# Patient Record
Sex: Female | Born: 1960 | Race: Black or African American | Hispanic: No | Marital: Married | Smoking: Never smoker
Health system: Southern US, Community
[De-identification: ages and names within clinical notes are randomized; demographics above are authoritative.]

## PROBLEM LIST (undated history)

## (undated) DIAGNOSIS — I10 Essential (primary) hypertension: Secondary | ICD-10-CM

## (undated) DIAGNOSIS — G43909 Migraine, unspecified, not intractable, without status migrainosus: Secondary | ICD-10-CM

## (undated) DIAGNOSIS — E785 Hyperlipidemia, unspecified: Secondary | ICD-10-CM

## (undated) DIAGNOSIS — K219 Gastro-esophageal reflux disease without esophagitis: Secondary | ICD-10-CM

## (undated) DIAGNOSIS — K589 Irritable bowel syndrome without diarrhea: Secondary | ICD-10-CM

## (undated) DIAGNOSIS — G459 Transient cerebral ischemic attack, unspecified: Secondary | ICD-10-CM

## (undated) HISTORY — DX: Gastro-esophageal reflux disease without esophagitis: K21.9

## (undated) HISTORY — PX: ABDOMINAL HYSTERECTOMY: SHX81

## (undated) HISTORY — DX: Irritable bowel syndrome, unspecified: K58.9

## (undated) HISTORY — DX: Transient cerebral ischemic attack, unspecified: G45.9

## (undated) HISTORY — DX: Essential (primary) hypertension: I10

## (undated) HISTORY — PX: BUNIONECTOMY: SHX129

## (undated) HISTORY — DX: Migraine, unspecified, not intractable, without status migrainosus: G43.909

## (undated) HISTORY — PX: OTHER SURGICAL HISTORY: SHX169

## (undated) HISTORY — DX: Hyperlipidemia, unspecified: E78.5

---

## 1998-06-16 ENCOUNTER — Ambulatory Visit (HOSPITAL_COMMUNITY): Admission: RE | Admit: 1998-06-16 | Discharge: 1998-06-16 | Payer: Self-pay | Admitting: Family Medicine

## 1999-05-09 ENCOUNTER — Encounter: Payer: Self-pay | Admitting: Obstetrics and Gynecology

## 1999-05-14 ENCOUNTER — Inpatient Hospital Stay (HOSPITAL_COMMUNITY): Admission: RE | Admit: 1999-05-14 | Discharge: 1999-05-17 | Payer: Self-pay | Admitting: Obstetrics and Gynecology

## 2000-09-11 ENCOUNTER — Ambulatory Visit (HOSPITAL_COMMUNITY): Admission: RE | Admit: 2000-09-11 | Discharge: 2000-09-11 | Payer: Self-pay | Admitting: Gastroenterology

## 2001-01-08 ENCOUNTER — Encounter: Payer: Self-pay | Admitting: Family Medicine

## 2001-01-08 ENCOUNTER — Ambulatory Visit (HOSPITAL_COMMUNITY): Admission: RE | Admit: 2001-01-08 | Discharge: 2001-01-08 | Payer: Self-pay | Admitting: Family Medicine

## 2001-05-29 ENCOUNTER — Other Ambulatory Visit: Admission: RE | Admit: 2001-05-29 | Discharge: 2001-05-29 | Payer: Self-pay | Admitting: Obstetrics and Gynecology

## 2001-12-29 ENCOUNTER — Ambulatory Visit (HOSPITAL_COMMUNITY): Admission: RE | Admit: 2001-12-29 | Discharge: 2001-12-29 | Payer: Self-pay | Admitting: Family Medicine

## 2001-12-29 ENCOUNTER — Encounter: Payer: Self-pay | Admitting: Family Medicine

## 2002-06-17 ENCOUNTER — Other Ambulatory Visit: Admission: RE | Admit: 2002-06-17 | Discharge: 2002-06-17 | Payer: Self-pay | Admitting: Obstetrics and Gynecology

## 2002-07-29 ENCOUNTER — Ambulatory Visit (HOSPITAL_COMMUNITY): Admission: RE | Admit: 2002-07-29 | Discharge: 2002-07-29 | Payer: Self-pay | Admitting: Family Medicine

## 2002-07-29 ENCOUNTER — Encounter: Payer: Self-pay | Admitting: Family Medicine

## 2003-07-01 ENCOUNTER — Other Ambulatory Visit: Admission: RE | Admit: 2003-07-01 | Discharge: 2003-07-01 | Payer: Self-pay | Admitting: Obstetrics and Gynecology

## 2004-08-13 ENCOUNTER — Other Ambulatory Visit: Admission: RE | Admit: 2004-08-13 | Discharge: 2004-08-13 | Payer: Self-pay | Admitting: Obstetrics and Gynecology

## 2004-09-14 ENCOUNTER — Encounter: Admission: RE | Admit: 2004-09-14 | Discharge: 2004-09-14 | Payer: Self-pay | Admitting: Gastroenterology

## 2005-09-26 ENCOUNTER — Other Ambulatory Visit: Admission: RE | Admit: 2005-09-26 | Discharge: 2005-09-26 | Payer: Self-pay | Admitting: Obstetrics and Gynecology

## 2007-01-29 ENCOUNTER — Ambulatory Visit: Admission: RE | Admit: 2007-01-29 | Discharge: 2007-01-29 | Payer: Self-pay | Admitting: Family Medicine

## 2007-01-29 ENCOUNTER — Ambulatory Visit: Payer: Self-pay | Admitting: Vascular Surgery

## 2008-12-27 ENCOUNTER — Encounter: Admission: RE | Admit: 2008-12-27 | Discharge: 2008-12-27 | Payer: Self-pay | Admitting: Obstetrics and Gynecology

## 2010-02-03 ENCOUNTER — Encounter: Payer: Self-pay | Admitting: Emergency Medicine

## 2010-02-03 ENCOUNTER — Inpatient Hospital Stay (HOSPITAL_COMMUNITY): Admission: EM | Admit: 2010-02-03 | Discharge: 2010-02-06 | Payer: Self-pay | Admitting: Pediatrics

## 2010-02-03 ENCOUNTER — Ambulatory Visit: Payer: Self-pay | Admitting: Cardiovascular Disease

## 2010-02-05 ENCOUNTER — Encounter (INDEPENDENT_AMBULATORY_CARE_PROVIDER_SITE_OTHER): Payer: Self-pay | Admitting: Pediatrics

## 2010-05-04 ENCOUNTER — Encounter: Payer: Self-pay | Admitting: Internal Medicine

## 2010-06-23 ENCOUNTER — Emergency Department (HOSPITAL_COMMUNITY): Admission: EM | Admit: 2010-06-23 | Discharge: 2010-06-23 | Payer: Self-pay | Admitting: Emergency Medicine

## 2010-11-12 ENCOUNTER — Encounter
Admission: RE | Admit: 2010-11-12 | Discharge: 2010-11-12 | Payer: Self-pay | Source: Home / Self Care | Attending: Family Medicine | Admitting: Family Medicine

## 2010-12-23 ENCOUNTER — Encounter: Payer: Self-pay | Admitting: Obstetrics and Gynecology

## 2011-01-03 NOTE — Procedures (Signed)
Summary: Pt Avtivity Report  Pt Avtivity Report   Imported By: Erle Crocker 06/27/2010 08:44:49  _____________________________________________________________________  External Attachment:    Type:   Image     Comment:   External Document

## 2011-02-16 LAB — DIFFERENTIAL
Basophils Absolute: 0 10*3/uL (ref 0.0–0.1)
Eosinophils Absolute: 0.3 10*3/uL (ref 0.0–0.7)
Eosinophils Relative: 5 % (ref 0–5)
Monocytes Absolute: 0.6 10*3/uL (ref 0.1–1.0)
Monocytes Relative: 10 % (ref 3–12)
Neutro Abs: 3.7 10*3/uL (ref 1.7–7.7)

## 2011-02-16 LAB — COMPREHENSIVE METABOLIC PANEL
AST: 18 U/L (ref 0–37)
Alkaline Phosphatase: 39 U/L (ref 39–117)
BUN: 13 mg/dL (ref 6–23)
CO2: 26 mEq/L (ref 19–32)
GFR calc Af Amer: >60 mL/min (ref >60)
GFR calc non Af Amer: >60 mL/min (ref >60)
Sodium: 135 mEq/L (ref 135–145)
Total Bilirubin: 0.6 mg/dL (ref 0.3–1.2)
Total Protein: 7.7 g/dL (ref 6.0–8.3)

## 2011-02-16 LAB — POCT CARDIAC MARKERS: Troponin i, poc: <0.05 ng/mL (ref 0.00–0.09)

## 2011-02-16 LAB — MAGNESIUM: Magnesium: 2.3 mg/dL (ref 1.5–2.5)

## 2011-02-16 LAB — CBC
Hemoglobin: 12.2 g/dL (ref 12.0–15.0)
MCHC: 33.5 g/dL (ref 30.0–36.0)
MCV: 85.2 fL (ref 78.0–100.0)
RBC: 4.28 MIL/uL (ref 3.87–5.11)
WBC: 6.4 10*3/uL (ref 4.0–10.5)

## 2011-02-16 LAB — TSH: TSH: 0.661 u[IU]/mL (ref 0.350–4.500)

## 2011-02-25 LAB — PROTEIN C, TOTAL
Protein C, Total: 106 % (ref 70–140)
Protein C, Total: 119 % (ref 70–140)

## 2011-02-25 LAB — LUPUS ANTICOAGULANT PANEL
DRVVT: 38.5 secs (ref 36.2–44.3)
PTT Lupus Anticoagulant: 33.5 secs (ref 32.0–43.4)

## 2011-02-25 LAB — PROTEIN C ACTIVITY: Protein C Activity: 137 % — ABNORMAL HIGH (ref 75–133)

## 2011-02-25 LAB — DRUGS OF ABUSE SCREEN W/O ALC, ROUTINE URINE
Amphetamine Screen, Ur: NEGATIVE
Barbiturate Quant, Ur: NEGATIVE
Creatinine,U: 175.9 mg/dL
Marijuana Metabolite: NEGATIVE
Propoxyphene: NEGATIVE

## 2011-02-25 LAB — PROTIME-INR
INR: 0.97 (ref 0.00–1.49)
Prothrombin Time: 12.8 seconds (ref 11.6–15.2)

## 2011-02-25 LAB — URINE MICROSCOPIC-ADD ON

## 2011-02-25 LAB — HOMOCYSTEINE: Homocysteine: 10.3 umol/L (ref 4.0–15.4)

## 2011-02-25 LAB — DIFFERENTIAL
Basophils Absolute: 0.1 10*3/uL (ref 0.0–0.1)
Basophils Relative: 0 % (ref 0–1)
Eosinophils Absolute: 0.1 10*3/uL (ref 0.0–0.7)
Eosinophils Relative: 1 % (ref 0–5)
Lymphocytes Relative: 11 % — ABNORMAL LOW (ref 12–46)
Lymphs Abs: 1.5 10*3/uL (ref 0.7–4.0)
Monocytes Absolute: 0.6 10*3/uL (ref 0.1–1.0)
Monocytes Relative: 4 % (ref 3–12)
Neutro Abs: 11.5 10*3/uL — ABNORMAL HIGH (ref 1.7–7.7)

## 2011-02-25 LAB — URINALYSIS, ROUTINE W REFLEX MICROSCOPIC
Bilirubin Urine: NEGATIVE
Glucose, UA: NEGATIVE mg/dL
Ketones, ur: NEGATIVE mg/dL
Protein, ur: NEGATIVE mg/dL
pH: 5.5 (ref 5.0–8.0)

## 2011-02-25 LAB — PROTEIN S, TOTAL
Protein S Ag, Total: 102 % (ref 70–140)
Protein S Ag, Total: 87 % (ref 70–140)

## 2011-02-25 LAB — BETA-2-GLYCOPROTEIN I ABS, IGG/M/A
Beta-2 Glyco I IgG: <3 U/mL (ref <=15)
Beta-2-Glycoprotein I IgA: 3 U/mL (ref <=15)
Beta-2-Glycoprotein I IgA: 4 U/mL (ref <=15)
Beta-2-Glycoprotein I IgM: <3 U/mL (ref <=15)

## 2011-02-25 LAB — COMPREHENSIVE METABOLIC PANEL
Alkaline Phosphatase: 46 U/L (ref 39–117)
Calcium: 8.8 mg/dL (ref 8.4–10.5)
Chloride: 105 mEq/L (ref 96–112)
GFR calc non Af Amer: >60 mL/min (ref >60)
Glucose, Bld: 103 mg/dL — ABNORMAL HIGH (ref 70–99)

## 2011-02-25 LAB — CBC
Hemoglobin: 12.6 g/dL (ref 12.0–15.0)
MCHC: 30.4 g/dL (ref 30.0–36.0)

## 2011-02-25 LAB — LIPID PANEL
Cholesterol: 174 mg/dL (ref 0–200)
LDL Cholesterol: 126 mg/dL — ABNORMAL HIGH (ref 0–99)
Triglycerides: 57 mg/dL (ref <150)
VLDL: 11 mg/dL (ref 0–40)

## 2011-02-25 LAB — HEMOGLOBIN A1C
Hgb A1c MFr Bld: 6.4 % — ABNORMAL HIGH (ref 4.6–6.1)
Mean Plasma Glucose: 137 mg/dL

## 2011-02-25 LAB — CARDIOLIPIN ANTIBODIES, IGG, IGM, IGA: Anticardiolipin IgM: <3 MPL U/mL — ABNORMAL LOW (ref <10)

## 2011-02-25 LAB — CK TOTAL AND CKMB (NOT AT ARMC)
CK, MB: 0.6 ng/mL (ref 0.3–4.0)
Total CK: 81 U/L (ref 7–177)

## 2011-02-25 LAB — GLUCOSE, CAPILLARY: Glucose-Capillary: 108 mg/dL — ABNORMAL HIGH (ref 70–99)

## 2011-02-25 LAB — ANTITHROMBIN III
AntiThromb III Func: 107 % (ref 76–126)
AntiThromb III Func: 90 % (ref 76–126)

## 2011-02-25 LAB — RPR: RPR Ser Ql: NONREACTIVE

## 2011-02-25 LAB — COMPLEMENT, TOTAL: Compl, Total (CH50): 54 U/mL (ref 31–60)

## 2011-02-25 LAB — ANA: Anti Nuclear Antibody(ANA): NEGATIVE

## 2011-02-25 LAB — PROTHROMBIN GENE MUTATION

## 2011-04-19 NOTE — Procedures (Signed)
[facility]  Patient:    Mackenzie Carr, Mackenzie Carr              MRN: [id number] Proc. Date: 09/11/00 Adm. Date:  [id number] Disc. Date: [id number] Attending:  Nelda Marseille CC:         Dario Guardian, M.D.  Guy Sandifer Arleta [place], M.D.   Procedure Report  PROCEDURE:  Colonoscopy.  INDICATION:  Patient with multiple GI complaints, actually already improved. Family history of colon cancer on both sides of the family.  Consent was signed after risks, benefits, and options thoroughly discussed in the office.  MEDICATIONS:  Demerol 50 mg, Versed 5 mg.  DESCRIPTION OF PROCEDURE:  Rectal inspection is pertinent for external hemorrhoids.  Digital exam was negative.  Video colonoscope was inserted, fairly easily advanced around the colon to the cecum.  This did require rolling her on her back and some abdominal pressure.  The cecum was identified by the appendiceal orifice and the ileocecal valve.  In fact, the scope was inserted a short way into the terminal ileum, which was normal.  Photo documentation was obtained.  The scope was slowly withdrawn.  No obvious abnormality was seen on insertion and slow withdrawal to the rectum.  There was minimal liquid stools that required washing and suctioning, but no abnormalities were seen.  Her colon was tortuous, particularly in the flexures and the sigmoid, and when we fell back around a tortuous curve, we did try to re-advance around the corners to decrease the chance of missing things, but no abnormalities were seen.  Once back in the rectum, the scope was retroflexed, pertinent for some internal hemorrhoids.  The scope was straightened, air withdrawn, and the scope removed.  The patient tolerated the procedure well. There was no obvious immediate complication.  ENDOSCOPIC DIAGNOSES: 1. Internal-external hemorrhoids. 2. Otherwise within normal limits to the terminal ileum except for some     tortuosity.  PLAN:  Yearly rectals and guaiacs per Dr. Katrinka Blazing or Henderson Cloud.  I will see her back p.r.n.  Might consider an antispasmodic if cramps or bloating continue, otherwise recheck screening questionably in five to seven years. DD:  09/11/00 TD:  09/13/00 Job: [id number]

## 2011-09-02 HISTORY — PX: COLONOSCOPY: SHX174

## 2012-12-31 ENCOUNTER — Other Ambulatory Visit: Payer: Self-pay

## 2013-08-16 ENCOUNTER — Other Ambulatory Visit: Payer: Self-pay | Admitting: Obstetrics and Gynecology

## 2013-08-31 ENCOUNTER — Ambulatory Visit
Admission: RE | Admit: 2013-08-31 | Discharge: 2013-08-31 | Disposition: A | Discharge status: Home / Self Care | Payer: 59 | Source: Ambulatory Visit | Attending: Family Medicine | Admitting: Family Medicine

## 2013-08-31 ENCOUNTER — Other Ambulatory Visit: Payer: Self-pay | Admitting: Family Medicine

## 2013-08-31 DIAGNOSIS — M545 Low back pain: Secondary | ICD-10-CM

## 2013-11-24 ENCOUNTER — Telehealth: Payer: Self-pay | Admitting: Cardiology

## 2013-11-24 NOTE — Telephone Encounter (Deleted)
error 

## 2013-11-26 ENCOUNTER — Encounter: Payer: Self-pay | Admitting: General Surgery

## 2013-11-26 ENCOUNTER — Encounter: Payer: Self-pay | Admitting: Cardiology

## 2013-11-26 ENCOUNTER — Ambulatory Visit (INDEPENDENT_AMBULATORY_CARE_PROVIDER_SITE_OTHER): Payer: 59 | Admitting: Cardiology

## 2013-11-26 VITALS — BP 110/80 | HR 96 | Ht 63.0 in | Wt 161.0 lb

## 2013-11-26 DIAGNOSIS — I10 Essential (primary) hypertension: Secondary | ICD-10-CM | POA: Insufficient documentation

## 2013-11-26 DIAGNOSIS — R079 Chest pain, unspecified: Secondary | ICD-10-CM

## 2013-11-26 DIAGNOSIS — Z8673 Personal history of transient ischemic attack (TIA), and cerebral infarction without residual deficits: Secondary | ICD-10-CM

## 2013-11-26 DIAGNOSIS — K219 Gastro-esophageal reflux disease without esophagitis: Secondary | ICD-10-CM

## 2013-11-26 NOTE — Progress Notes (Signed)
[address] [place], [place]  [address] Phone: [phone] Fax:  [phone]  Date:  11/26/2013   ID:  Mackenzie Carr, DOB [birth year], MRN [id number]  PCP:  Allean Found, MD   History of Present Illness: Mackenzie Carr is a 52 y.o. female here for evaluation of chest pain with prior evaluation of dizziness, lightheadedness, palpitations. Was in [place] at cancer center visiting sister. Normal morning for her. Pain started across top of back between shoulder blades. Stretched and no real change. Started going across chest with tightness. Tried to sit down and calm down. Told sister that something was not right. Kept getting worse, then radiated down right arm, hand tingling, shortness of breath. If she moved or breathed deep pain would worsen. Never had anything ever close to that. Went across chest wall to back. Would cough to see if that would help but it did not. Tried to calm down and relax. Happened about 10 min duration. EMS called. NTG and ASA given. Went to ER. Had HA from NTG. Troponin normal. Felt different that prior GERD.   She had a cardiac workup with a Holter monitor, echocardiogram and carotid Dopplers which were normal. These were ordered by Dr. Pearlean Brownie of neurology. No prior history of panic or anxiety. Nuclear stress test in 2011 was low risk with no perfusion defects. This was done because of ST segment depressions noted on exercise treadmill test, false positive.    Wt Readings from Last 3 Encounters:  11/26/13 161 lb (73.029 kg)  11/26/13 164 lb 12.8 oz (74.753 kg)     Past Medical History  Diagnosis Date  . Hyperlipidemia   . Hypertension   . GERD (gastroesophageal reflux disease)   . IBS (irritable bowel syndrome)   . TIA (transient ischemic attack)     MRI 02/03/10 normal. Echo 3/7/11normal EF,normal. Carotid doppler 02/05/10 normal. Cardionet moniitor-no AFIB  . Migraines     Past Surgical History  Procedure  Laterality Date  . Colonoscopy  09/2011    hyperplastic polyps,diverticula,repeat 10 years-Dr Magod  . Bladder tacking    . Bunionectomy Bilateral     Current Outpatient Prescriptions  Medication Sig Dispense Refill  . aspirin 325 MG tablet Take 325 mg by mouth as needed.      . cyclobenzaprine (FLEXERIL) 5 MG tablet Take 5 mg by mouth as needed.      . fexofenadine (ALLEGRA) 180 MG tablet Take 180 mg by mouth as needed for allergies or rhinitis.      Marland Kitchen lansoprazole (PREVACID) 30 MG capsule Take 30 mg by mouth daily.      Marland Kitchen lisinopril (PRINIVIL,ZESTRIL) 10 MG tablet Take 10 mg by mouth daily.      . traMADol (ULTRAM) 50 MG tablet Take 50 mg by mouth as needed.      . triamterene-hydrochlorothiazide (MAXZIDE) 75-50 MG per tablet Take 1 tablet by mouth daily.       No current facility-administered medications for this visit.    Allergies:    Allergies  Allergen Reactions  . Sulfa Antibiotics     THRUSH    Social History:  The patient  reports that she has never smoked. She does not have any smokeless tobacco history on file. She reports that she does not drink alcohol or use illicit drugs.   Family History  Problem Relation Age of Onset  . Diabetes Mother   . Cancer Father   . Cancer Maternal Grandfather   .  Cancer Paternal Grandfather   . Diabetes Sister    Mother's sister was 40's with MI. First cousin had MI at 73.   ROS:  Please see the history of present illness.   Denies any bleeding, syncope, orthopnea, PND  all other review of systems negative unless mentioned above.  PHYSICAL EXAM: VS:  BP 110/80  Pulse 96  Ht 5\' 3"  (1.6 m)  Wt 161 lb (73.029 kg)  BMI 28.53 kg/m2 Well nourished, well developed, in no acute distressMildly anxious HEENT: normalEOMI Neck: no JVDSupple Cardiac:  normal S1, S2; RRR; no murmurNo rubs Lungs:  clear to auscultation bilaterally, no wheezing, rhonchi or ralesNormal respiratory effort Abd: soft, nontender, no hepatomegaly Ext: no  edemaNormal distal pulses Skin: warm and dryNo rashes Neuro: no focal abnormalities noted moves all extremities x4 GU deferred  EKG:  Sinus rhythm rate 96, borderline low-voltage, otherwise normal EKG    ASSESSMENT AND PLAN:  1. Chest pain - will check NUC stress test. (Prior ETT was false positive). Her symptoms were new to her, quite severe and intense. Could be anginal symptom but also has atypical features as well. 2. Hypertension-continue with current regimen, well controlled 3. GERD-Prevacid, per primary. Symptoms were very different from prior GERD. 4. Prior TIA-previous workup unremarkable.  Signed, Donato Schultz, MD [facility]  11/26/2013 9:36 AM

## 2013-11-26 NOTE — Patient Instructions (Signed)
Your physician recommends that you continue on your current medications as directed. Please refer to the Current Medication list given to you today.  Your physician has requested that you have an exercise stress myoview. For further information please visit [email or web address] Please follow instruction sheet, as given.  Your physician recommends that you schedule a follow-up appointment: As needed

## 2013-11-29 ENCOUNTER — Other Ambulatory Visit: Payer: Self-pay | Admitting: Cardiology

## 2013-11-29 ENCOUNTER — Encounter: Payer: Self-pay | Admitting: Cardiology

## 2013-11-29 DIAGNOSIS — R079 Chest pain, unspecified: Secondary | ICD-10-CM

## 2013-11-30 ENCOUNTER — Encounter (HOSPITAL_COMMUNITY): Payer: 59

## 2013-12-01 ENCOUNTER — Ambulatory Visit (HOSPITAL_COMMUNITY)
Admission: RE | Admit: 2013-12-01 | Discharge: 2013-12-01 | Disposition: A | Discharge status: Home / Self Care | Payer: 59 | Source: Ambulatory Visit | Attending: Internal Medicine | Admitting: Internal Medicine

## 2013-12-01 DIAGNOSIS — R079 Chest pain, unspecified: Secondary | ICD-10-CM

## 2013-12-15 ENCOUNTER — Other Ambulatory Visit: Payer: Self-pay | Admitting: Family Medicine

## 2013-12-15 DIAGNOSIS — Z20828 Contact with and (suspected) exposure to other viral communicable diseases: Secondary | ICD-10-CM

## 2013-12-15 MED ORDER — OSELTAMIVIR PHOSPHATE 75 MG PO CAPS: 75.0000 mg | ORAL_CAPSULE | Freq: Every day | ORAL | Status: DC

## 2013-12-15 NOTE — Progress Notes (Signed)
In office with grandson with influenza. Has not had flu vaccine.

## 2014-08-05 ENCOUNTER — Encounter: Payer: Self-pay | Admitting: General Surgery

## 2014-08-12 ENCOUNTER — Other Ambulatory Visit (HOSPITAL_BASED_OUTPATIENT_CLINIC_OR_DEPARTMENT_OTHER): Payer: Self-pay | Admitting: Orthopedic Surgery

## 2014-08-12 DIAGNOSIS — M25511 Pain in right shoulder: Secondary | ICD-10-CM

## 2014-08-13 ENCOUNTER — Ambulatory Visit (HOSPITAL_BASED_OUTPATIENT_CLINIC_OR_DEPARTMENT_OTHER)
Admission: RE | Admit: 2014-08-13 | Discharge: 2014-08-13 | Disposition: A | Discharge status: Home / Self Care | Payer: 59 | Source: Ambulatory Visit | Attending: Orthopedic Surgery | Admitting: Orthopedic Surgery

## 2014-08-13 DIAGNOSIS — R29898 Other symptoms and signs involving the musculoskeletal system: Secondary | ICD-10-CM | POA: Diagnosis not present

## 2014-08-13 DIAGNOSIS — R937 Abnormal findings on diagnostic imaging of other parts of musculoskeletal system: Secondary | ICD-10-CM | POA: Diagnosis not present

## 2014-08-13 DIAGNOSIS — M25519 Pain in unspecified shoulder: Secondary | ICD-10-CM | POA: Diagnosis present

## 2014-08-13 DIAGNOSIS — M25511 Pain in right shoulder: Secondary | ICD-10-CM

## 2014-08-13 DIAGNOSIS — M25619 Stiffness of unspecified shoulder, not elsewhere classified: Secondary | ICD-10-CM | POA: Diagnosis not present

## 2014-08-17 ENCOUNTER — Ambulatory Visit (HOSPITAL_BASED_OUTPATIENT_CLINIC_OR_DEPARTMENT_OTHER): Payer: 59

## 2014-09-13 NOTE — Telephone Encounter (Signed)
Error

## 2014-11-07 ENCOUNTER — Other Ambulatory Visit: Payer: Self-pay | Admitting: Obstetrics and Gynecology

## 2014-11-08 LAB — CYTOLOGY - PAP

## 2015-05-25 ENCOUNTER — Observation Stay (HOSPITAL_COMMUNITY): Payer: 59 | Admitting: Anesthesiology

## 2015-05-25 ENCOUNTER — Ambulatory Visit (HOSPITAL_COMMUNITY)
Admission: EM | Admit: 2015-05-25 | Discharge: 2015-05-26 | Disposition: A | Discharge status: Home / Self Care | Payer: 59 | Source: Ambulatory Visit | Attending: General Surgery | Admitting: General Surgery

## 2015-05-25 ENCOUNTER — Other Ambulatory Visit: Payer: Self-pay | Admitting: General Surgery

## 2015-05-25 ENCOUNTER — Ambulatory Visit
Admission: RE | Admit: 2015-05-25 | Discharge: 2015-05-25 | Disposition: A | Discharge status: Home / Self Care | Payer: 59 | Source: Ambulatory Visit | Attending: Family Medicine | Admitting: Family Medicine

## 2015-05-25 ENCOUNTER — Emergency Department (HOSPITAL_COMMUNITY): Admission: EM | Admit: 2015-05-25 | Discharge: 2015-05-25 | Discharge status: Absent without leave | Payer: 59

## 2015-05-25 ENCOUNTER — Encounter (HOSPITAL_COMMUNITY): Payer: Self-pay | Admitting: *Deleted

## 2015-05-25 ENCOUNTER — Other Ambulatory Visit: Payer: Self-pay | Admitting: Family Medicine

## 2015-05-25 ENCOUNTER — Encounter: Payer: Self-pay | Admitting: General Surgery

## 2015-05-25 ENCOUNTER — Encounter (HOSPITAL_COMMUNITY): Admission: EM | Disposition: A | Discharge status: Home / Self Care | Payer: Self-pay | Source: Ambulatory Visit

## 2015-05-25 DIAGNOSIS — E785 Hyperlipidemia, unspecified: Secondary | ICD-10-CM | POA: Diagnosis not present

## 2015-05-25 DIAGNOSIS — K219 Gastro-esophageal reflux disease without esophagitis: Secondary | ICD-10-CM | POA: Diagnosis not present

## 2015-05-25 DIAGNOSIS — Z8673 Personal history of transient ischemic attack (TIA), and cerebral infarction without residual deficits: Secondary | ICD-10-CM | POA: Diagnosis not present

## 2015-05-25 DIAGNOSIS — K589 Irritable bowel syndrome without diarrhea: Secondary | ICD-10-CM | POA: Diagnosis not present

## 2015-05-25 DIAGNOSIS — Z882 Allergy status to sulfonamides status: Secondary | ICD-10-CM | POA: Diagnosis not present

## 2015-05-25 DIAGNOSIS — K3580 Unspecified acute appendicitis: Principal | ICD-10-CM | POA: Insufficient documentation

## 2015-05-25 DIAGNOSIS — Z888 Allergy status to other drugs, medicaments and biological substances status: Secondary | ICD-10-CM | POA: Diagnosis not present

## 2015-05-25 DIAGNOSIS — R1031 Right lower quadrant pain: Secondary | ICD-10-CM

## 2015-05-25 DIAGNOSIS — Z7982 Long term (current) use of aspirin: Secondary | ICD-10-CM | POA: Insufficient documentation

## 2015-05-25 DIAGNOSIS — I10 Essential (primary) hypertension: Secondary | ICD-10-CM | POA: Diagnosis not present

## 2015-05-25 DIAGNOSIS — R109 Unspecified abdominal pain: Secondary | ICD-10-CM | POA: Diagnosis present

## 2015-05-25 HISTORY — PX: LAPAROSCOPIC APPENDECTOMY: SHX408

## 2015-05-25 LAB — CBC
HEMATOCRIT: 37.3 % (ref 36.0–46.0)
HEMOGLOBIN: 11.8 g/dL — AB (ref 12.0–15.0)
MCH: 26.4 pg (ref 26.0–34.0)
MCHC: 31.6 g/dL (ref 30.0–36.0)
MCV: 83.4 fL (ref 78.0–100.0)
PLATELETS: 253 10*3/uL (ref 150–400)
RBC: 4.47 MIL/uL (ref 3.87–5.11)
RDW: 14.5 % (ref 11.5–15.5)
WBC: 6.5 10*3/uL (ref 4.0–10.5)

## 2015-05-25 LAB — BASIC METABOLIC PANEL
ANION GAP: 9 (ref 5–15)
BUN: 13 mg/dL (ref 6–20)
CHLORIDE: 100 mmol/L — AB (ref 101–111)
CO2: 29 mmol/L (ref 22–32)
Calcium: 9 mg/dL (ref 8.9–10.3)
Creatinine, Ser: 0.87 mg/dL (ref 0.44–1.00)
Glucose, Bld: 85 mg/dL (ref 65–99)
POTASSIUM: 3.8 mmol/L (ref 3.5–5.1)
SODIUM: 138 mmol/L (ref 135–145)

## 2015-05-25 SURGERY — APPENDECTOMY, LAPAROSCOPIC
Anesthesia: General

## 2015-05-25 MED ORDER — FENTANYL CITRATE (PF) 100 MCG/2ML IJ SOLN
INTRAMUSCULAR | Status: AC
  Filled 2015-05-25: qty 2

## 2015-05-25 MED ORDER — ONDANSETRON HCL 4 MG/2ML IJ SOLN: 4.0000 mg | INTRAMUSCULAR | Status: DC | PRN

## 2015-05-25 MED ORDER — ROCURONIUM BROMIDE 100 MG/10ML IV SOLN
INTRAVENOUS | Status: DC | PRN
  Administered 2015-05-25: 30 mg via INTRAVENOUS

## 2015-05-25 MED ORDER — PROPOFOL 10 MG/ML IV BOLUS
INTRAVENOUS | Status: AC
  Filled 2015-05-25: qty 20

## 2015-05-25 MED ORDER — DEXTROSE 5 % IV SOLN
2.0000 g | INTRAVENOUS | Status: AC
  Administered 2015-05-25: 2 g via INTRAVENOUS

## 2015-05-25 MED ORDER — PROMETHAZINE HCL 25 MG/ML IJ SOLN
6.2500 mg | INTRAMUSCULAR | Status: DC | PRN
Start: 2015-05-25 — End: 2015-05-25

## 2015-05-25 MED ORDER — DEXTROSE IN LACTATED RINGERS 5 % IV SOLN
INTRAVENOUS | Status: DC
Start: 2015-05-25 — End: 2015-05-26
  Administered 2015-05-25: 1000 mL via INTRAVENOUS
  Administered 2015-05-25: 100 mL/h via INTRAVENOUS
  Administered 2015-05-26: 100 mL via INTRAVENOUS

## 2015-05-25 MED ORDER — MIDAZOLAM HCL 5 MG/5ML IJ SOLN
INTRAMUSCULAR | Status: DC | PRN
  Administered 2015-05-25: 2 mg via INTRAVENOUS

## 2015-05-25 MED ORDER — ONDANSETRON HCL 4 MG/2ML IJ SOLN
INTRAMUSCULAR | Status: AC
  Filled 2015-05-25: qty 2

## 2015-05-25 MED ORDER — DEXAMETHASONE SODIUM PHOSPHATE 10 MG/ML IJ SOLN
INTRAMUSCULAR | Status: DC | PRN
  Administered 2015-05-25: 10 mg via INTRAVENOUS

## 2015-05-25 MED ORDER — BUPIVACAINE HCL (PF) 0.5 % IJ SOLN
INTRAMUSCULAR | Status: DC | PRN
  Administered 2015-05-25: 12 mL

## 2015-05-25 MED ORDER — NEOSTIGMINE METHYLSULFATE 10 MG/10ML IV SOLN
INTRAVENOUS | Status: DC | PRN
  Administered 2015-05-25: 4 mg via INTRAVENOUS

## 2015-05-25 MED ORDER — ONDANSETRON HCL 4 MG PO TABS: 4.0000 mg | ORAL_TABLET | Freq: Four times a day (QID) | ORAL | Status: DC | PRN

## 2015-05-25 MED ORDER — MEPERIDINE HCL 50 MG/ML IJ SOLN: 6.2500 mg | INTRAMUSCULAR | Status: DC | PRN

## 2015-05-25 MED ORDER — MIDAZOLAM HCL 2 MG/2ML IJ SOLN
INTRAMUSCULAR | Status: AC
  Filled 2015-05-25: qty 2

## 2015-05-25 MED ORDER — LIDOCAINE HCL (CARDIAC) 20 MG/ML IV SOLN
INTRAVENOUS | Status: DC | PRN
  Administered 2015-05-25: 30 mg via INTRAVENOUS

## 2015-05-25 MED ORDER — MORPHINE SULFATE 10 MG/ML IJ SOLN: 1.0000 mg | INTRAMUSCULAR | Status: DC | PRN

## 2015-05-25 MED ORDER — 0.9 % SODIUM CHLORIDE (POUR BTL) OPTIME
TOPICAL | Status: DC | PRN
  Administered 2015-05-25: 1000 mL

## 2015-05-25 MED ORDER — PROPOFOL 10 MG/ML IV BOLUS
INTRAVENOUS | Status: DC | PRN
  Administered 2015-05-25: 150 mg via INTRAVENOUS

## 2015-05-25 MED ORDER — DEXAMETHASONE SODIUM PHOSPHATE 10 MG/ML IJ SOLN
INTRAMUSCULAR | Status: AC
  Filled 2015-05-25: qty 1

## 2015-05-25 MED ORDER — DEXTROSE-NACL 5-0.45 % IV SOLN: INTRAVENOUS | Status: DC

## 2015-05-25 MED ORDER — DIPHENHYDRAMINE HCL 50 MG/ML IJ SOLN: 12.5000 mg | Freq: Four times a day (QID) | INTRAMUSCULAR | Status: DC | PRN

## 2015-05-25 MED ORDER — SUCCINYLCHOLINE CHLORIDE 20 MG/ML IJ SOLN
INTRAMUSCULAR | Status: DC | PRN
  Administered 2015-05-25: 100 mg via INTRAVENOUS

## 2015-05-25 MED ORDER — MORPHINE SULFATE 2 MG/ML IJ SOLN
2.0000 mg | INTRAMUSCULAR | Status: DC | PRN
  Administered 2015-05-25: 2 mg via INTRAVENOUS
  Administered 2015-05-25: 4 mg via INTRAVENOUS
  Filled 2015-05-25: qty 2
  Filled 2015-05-25: qty 1

## 2015-05-25 MED ORDER — ROCURONIUM BROMIDE 100 MG/10ML IV SOLN
INTRAVENOUS | Status: AC
  Filled 2015-05-25: qty 1

## 2015-05-25 MED ORDER — DEXTROSE 5 % IV SOLN
INTRAVENOUS | Status: AC
  Filled 2015-05-25: qty 2

## 2015-05-25 MED ORDER — IOPAMIDOL (ISOVUE-300) INJECTION 61%
100.0000 mL | Freq: Once | INTRAVENOUS | Status: AC | PRN
  Administered 2015-05-25: 100 mL via INTRAVENOUS

## 2015-05-25 MED ORDER — HYDROCODONE-ACETAMINOPHEN 5-325 MG PO TABS
1.0000 | ORAL_TABLET | ORAL | Status: DC | PRN
  Administered 2015-05-26 (×2): 1 via ORAL
  Administered 2015-05-26 (×2): 2 via ORAL
  Filled 2015-05-25: qty 2
  Filled 2015-05-25: qty 1
  Filled 2015-05-25: qty 2
  Filled 2015-05-25: qty 1

## 2015-05-25 MED ORDER — GLYCOPYRROLATE 0.2 MG/ML IJ SOLN
INTRAMUSCULAR | Status: AC
  Filled 2015-05-25: qty 3

## 2015-05-25 MED ORDER — CEFOXITIN SODIUM 1 G IV SOLR
1.0000 g | Freq: Four times a day (QID) | INTRAVENOUS | Status: DC
  Administered 2015-05-25 – 2015-05-26 (×2): 1 g via INTRAVENOUS
  Filled 2015-05-25 (×3): qty 1

## 2015-05-25 MED ORDER — LACTATED RINGERS IV SOLN
INTRAVENOUS | Status: DC | PRN
  Administered 2015-05-25: 16:00:00 via INTRAVENOUS

## 2015-05-25 MED ORDER — GLYCOPYRROLATE 0.2 MG/ML IJ SOLN
INTRAMUSCULAR | Status: DC | PRN
Start: 2015-05-25 — End: 2015-05-25
  Administered 2015-05-25: 0.6 mg via INTRAVENOUS

## 2015-05-25 MED ORDER — LIDOCAINE HCL (CARDIAC) 20 MG/ML IV SOLN
INTRAVENOUS | Status: AC
  Filled 2015-05-25: qty 5

## 2015-05-25 MED ORDER — ONDANSETRON HCL 4 MG/2ML IJ SOLN
4.0000 mg | Freq: Four times a day (QID) | INTRAMUSCULAR | Status: DC | PRN
  Administered 2015-05-25: 4 mg via INTRAVENOUS

## 2015-05-25 MED ORDER — FENTANYL CITRATE (PF) 250 MCG/5ML IJ SOLN
INTRAMUSCULAR | Status: AC
  Filled 2015-05-25: qty 5

## 2015-05-25 MED ORDER — LACTATED RINGERS IR SOLN
Status: DC | PRN
  Administered 2015-05-25: 1000 mL

## 2015-05-25 MED ORDER — FENTANYL CITRATE (PF) 100 MCG/2ML IJ SOLN
25.0000 ug | INTRAMUSCULAR | Status: DC | PRN
  Administered 2015-05-25: 50 ug via INTRAVENOUS
  Administered 2015-05-25 (×2): 25 ug via INTRAVENOUS

## 2015-05-25 MED ORDER — NEOSTIGMINE METHYLSULFATE 10 MG/10ML IV SOLN
INTRAVENOUS | Status: AC
  Filled 2015-05-25: qty 1

## 2015-05-25 MED ORDER — DIPHENHYDRAMINE HCL 12.5 MG/5ML PO ELIX: 12.5000 mg | ORAL_SOLUTION | Freq: Four times a day (QID) | ORAL | Status: DC | PRN

## 2015-05-25 MED ORDER — LACTATED RINGERS IV SOLN
INTRAVENOUS | Status: DC
  Administered 2015-05-25: 1000 mL via INTRAVENOUS

## 2015-05-25 MED ORDER — CETYLPYRIDINIUM CHLORIDE 0.05 % MT LIQD
7.0000 mL | Freq: Two times a day (BID) | OROMUCOSAL | Status: DC
  Administered 2015-05-25: 7 mL via OROMUCOSAL

## 2015-05-25 MED ORDER — BUPIVACAINE HCL (PF) 0.5 % IJ SOLN
INTRAMUSCULAR | Status: AC
  Filled 2015-05-25: qty 30

## 2015-05-25 MED ORDER — FENTANYL CITRATE (PF) 100 MCG/2ML IJ SOLN
INTRAMUSCULAR | Status: DC | PRN
  Administered 2015-05-25 (×3): 50 ug via INTRAVENOUS
  Administered 2015-05-25: 100 ug via INTRAVENOUS

## 2015-05-25 SURGICAL SUPPLY — 44 items
APL SKNCLS STERI-STRIP NONHPOA (GAUZE/BANDAGES/DRESSINGS) ×1
APPLIER CLIP 5 13 M/L LIGAMAX5 (MISCELLANEOUS)
APPLIER CLIP ROT 10 11.4 M/L (STAPLE)
APR CLP MED LRG 11.4X10 (STAPLE)
APR CLP MED LRG 5 ANG JAW (MISCELLANEOUS)
BAG SPEC RTRVL LRG 6X4 10 (ENDOMECHANICALS) ×1
BENZOIN TINCTURE PRP APPL 2/3 (GAUZE/BANDAGES/DRESSINGS) ×2 IMPLANT
CHLORAPREP W/TINT 26ML (MISCELLANEOUS) ×2 IMPLANT
CLIP APPLIE 5 13 M/L LIGAMAX5 (MISCELLANEOUS) IMPLANT
CLIP APPLIE ROT 10 11.4 M/L (STAPLE) IMPLANT
CUTTER FLEX LINEAR 45M (STAPLE) ×2 IMPLANT
DECANTER SPIKE VIAL GLASS SM (MISCELLANEOUS) ×2 IMPLANT
DRAIN CHANNEL 19F RND (DRAIN) IMPLANT
DRAPE LAPAROSCOPIC ABDOMINAL (DRAPES) ×2 IMPLANT
DRSG TEGADERM 2-3/8X2-3/4 SM (GAUZE/BANDAGES/DRESSINGS) ×4 IMPLANT
ELECT REM PT RETURN 9FT ADLT (ELECTROSURGICAL) ×2
ELECTRODE REM PT RTRN 9FT ADLT (ELECTROSURGICAL) ×1 IMPLANT
ENDOLOOP SUT PDS II  0 18 (SUTURE)
ENDOLOOP SUT PDS II 0 18 (SUTURE) IMPLANT
EVACUATOR SILICONE 100CC (DRAIN) IMPLANT
GAUZE SPONGE 2X2 8PLY STRL LF (GAUZE/BANDAGES/DRESSINGS) ×1 IMPLANT
GLOVE ECLIPSE 8.0 STRL XLNG CF (GLOVE) ×2 IMPLANT
GLOVE INDICATOR 8.0 STRL GRN (GLOVE) ×2 IMPLANT
GOWN STRL REUS W/TWL XL LVL3 (GOWN DISPOSABLE) ×6 IMPLANT
KIT BASIN OR (CUSTOM PROCEDURE TRAY) ×2 IMPLANT
POUCH SPECIMEN RETRIEVAL 10MM (ENDOMECHANICALS) ×2 IMPLANT
RELOAD 45 VASCULAR/THIN (ENDOMECHANICALS) IMPLANT
RELOAD STAPLE TA45 3.5 REG BLU (ENDOMECHANICALS) ×2 IMPLANT
SCISSORS LAP 5X35 DISP (ENDOMECHANICALS) ×2 IMPLANT
SET IRRIG TUBING LAPAROSCOPIC (IRRIGATION / IRRIGATOR) ×2 IMPLANT
SHEARS HARMONIC ACE PLUS 36CM (ENDOMECHANICALS) ×2 IMPLANT
SLEEVE XCEL OPT CAN 5 100 (ENDOMECHANICALS) ×2 IMPLANT
SOLUTION ANTI FOG 6CC (MISCELLANEOUS) IMPLANT
SPONGE GAUZE 2X2 STER 10/PKG (GAUZE/BANDAGES/DRESSINGS) ×1
STRIP CLOSURE SKIN 1/2X4 (GAUZE/BANDAGES/DRESSINGS) ×2 IMPLANT
SUT ETHILON 3 0 PS 1 (SUTURE) IMPLANT
SUT MNCRL AB 4-0 PS2 18 (SUTURE) ×2 IMPLANT
TOWEL OR 17X26 10 PK STRL BLUE (TOWEL DISPOSABLE) ×2 IMPLANT
TOWEL OR NON WOVEN STRL DISP B (DISPOSABLE) ×2 IMPLANT
TRAY FOLEY W/METER SILVER 14FR (SET/KITS/TRAYS/PACK) ×2 IMPLANT
TRAY LAPAROSCOPIC (CUSTOM PROCEDURE TRAY) ×2 IMPLANT
TROCAR BLADELESS OPT 5 100 (ENDOMECHANICALS) ×2 IMPLANT
TROCAR XCEL BLUNT TIP 100MML (ENDOMECHANICALS) ×2 IMPLANT
TUBING INSUFFLATION 10FT LAP (TUBING) ×2 IMPLANT

## 2015-05-25 NOTE — Anesthesia Procedure Notes (Signed)
Procedure Name: Intubation Date/Time: 05/25/2015 4:24 PM Performed by: Early Osmond E Pre-anesthesia Checklist: Patient identified, Emergency Drugs available, Suction available and Patient being monitored Patient Re-evaluated:Patient Re-evaluated prior to inductionOxygen Delivery Method: Circle System Utilized Preoxygenation: Pre-oxygenation with 100% oxygen Intubation Type: IV induction, Rapid sequence and Cricoid Pressure applied LMA: LMA flexible inserted Laryngoscope Size: Mac and 3 Grade View: Grade I Tube type: Oral Tube size: 7.5 mm Number of attempts: 2 (Attempt by SRNA w/o success, easy, atraumatic by Dr. Berneice Heinrich) Airway Equipment and Method: Stylet Placement Confirmation: ETT inserted through vocal cords under direct vision,  positive ETCO2 and breath sounds checked- equal and bilateral Secured at: 21 cm Tube secured with: Tape Dental Injury: Teeth and Oropharynx as per pre-operative assessment

## 2015-05-25 NOTE — Transfer of Care (Signed)
Immediate Anesthesia Transfer of Care Note  Patient: Mackenzie Carr  Procedure(s) Performed: Procedure(s): APPENDECTOMY LAPAROSCOPIC (N/A)  Patient Location: PACU  Anesthesia Type:General  Level of Consciousness:  sedated, patient cooperative and responds to stimulation  Airway & Oxygen Therapy:Patient Spontanous Breathing and Patient connected to face mask oxgen  Post-op Assessment:  Report given to PACU RN and Post -op Vital signs reviewed and stable  Post vital signs:  Reviewed and stable  Last Vitals:  Filed Vitals:   05/25/15 1400  BP: 138/90  Pulse: 76  Temp: 36.6 C  Resp: 16    Complications: No apparent anesthesia complications

## 2015-05-25 NOTE — Anesthesia Postprocedure Evaluation (Signed)
  Anesthesia Post-op Note  Patient: Mackenzie Carr  Procedure(s) Performed: Procedure(s): APPENDECTOMY LAPAROSCOPIC (N/A)  Patient Location: PACU  Anesthesia Type:General  Level of Consciousness: awake  Airway and Oxygen Therapy: Patient Spontanous Breathing  Post-op Pain: mild  Post-op Assessment: Post-op Vital signs reviewed, Patient's Cardiovascular Status Stable, Respiratory Function Stable and Patent Airway              Post-op Vital Signs: Reviewed and stable  Last Vitals:  Filed Vitals:   05/25/15 1745  BP: 133/75  Pulse: 87  Temp:   Resp: 12    Complications: No apparent anesthesia complications

## 2015-05-25 NOTE — Op Note (Signed)
Appendectomy, Lap, Procedure Note  Pre-operative Diagnosis: Acute appendicitis  Post-operative Diagnosis: Same  Procedure:  Laparoscopic appendectomy  Surgeon:  Avel Peace, M.D.  Anesthesia:  General   Indications:  This is a 54 year old female who had the onset of abdominal pain 2 days ago. The pain persisted and she was sent for a CT scan today which demonstrated findings consistent with acute appendicitis. She has no evidence of sepsis. No evidence of abscess on CT scan. She is now brought to the operating room for appendectomy.   Procedure Details  She was brought to the operating room, placed in the supine position and general anesthesia was induced, along with placement of orogastric tube, SCDs, and a Foley catheter. A timeout was performed. The abdomen was prepped and draped in a sterile fashion. A small infraumbilical incision was made through the skin, subcutaneous tissue, fascia, and peritoneum entering the peritoneal cavity under direct vision.  A pursestring suture was passed around the fascia with a 0 Vicryl.  The Hasson was introduced into the peritoneal cavity and the tails of the suture were used to hold the Hasson in place.   The pneumoperitoneum was then established to steady pressure of 15 mmHg.   The laparoscope was introduced and there was no evidence of bleeding or underlying organ injury. Additional 5 mm cannulas then placed in the left lower quadrant of the abdomen and the right upper quadrant region under direct visualization. A careful evaluation of the entire abdomen was carried out. The patient was placed in Trendelenburg and left lateral decubitus position. The small intestines were retracted in the cephalad and left lateral direction away from the pelvis and right lower quadrant. The patient was found to have an enlarged and inflamed appendix that was extending into the pelvis. There was no evidence of perforation.  The appendix was carefully mobilized. The  mesoappendix was was divided with the harmonic scalpel.   The appendix was amputated off the cecum, with a small cuff of cecum, using an endo-GIA stapler.  The appendix was placed in a retrieval bag and removed through the subumbilical port incision.    There was no evidence of bleeding, leakage, or complication after division of the appendix. Copious irrigation was  performed and irrigant fluid suctioned from the abdomen as much as possible.  The umbilical trocar was removed and the  port site fascia was closed via the purse string suture under laparoscopic vision. There was no residual palpable fascial defect.  The remaining trocars were removed and all  trocar site skin wounds were closed with 4-0 Monocryl.  Instrument, sponge, and needle counts were correct at the conclusion of the case.   Findings: The appendix was found to be inflamed. There were not signs of necrosis.  There was not perforation. There was not abscess formation.  Estimated Blood Loss:  less than 100 mL         Specimens: appendix         Complications:  None; patient tolerated the procedure well.         Disposition: PACU - hemodynamically stable.         Condition: stable

## 2015-05-25 NOTE — Anesthesia Preprocedure Evaluation (Addendum)
Anesthesia Evaluation  Patient identified by MRN, date of birth, ID band Patient awake    Reviewed: Allergy & Precautions, NPO status , Patient's Chart, lab work & pertinent test results, reviewed documented beta blocker date and time   Airway Mallampati: II   Neck ROM: Full    Dental  (+) Edentulous Upper, Partial Lower, Dental Advisory Given   Pulmonary neg pulmonary ROS,  breath sounds clear to auscultation        Cardiovascular hypertension, Pt. on medications Rhythm:Regular  ECHO 2011 EF 55%, Stress 2014 OK, EKG 2014 ok   Neuro/Psych  Headaches,    GI/Hepatic Neg liver ROS, GERD-  Medicated,  Endo/Other    Renal/GU negative Renal ROS     Musculoskeletal   Abdominal (+)  Abdomen: soft.    Peds  Hematology   Anesthesia Other Findings   Reproductive/Obstetrics                            Anesthesia Physical Anesthesia Plan  ASA: II and emergent  Anesthesia Plan: General   Post-op Pain Management:    Induction: Intravenous and Rapid sequence  Airway Management Planned: Oral ETT  Additional Equipment:   Intra-op Plan:   Post-operative Plan: Extubation in OR  Informed Consent: I have reviewed the patients History and Physical, chart, labs and discussed the procedure including the risks, benefits and alternatives for the proposed anesthesia with the patient or authorized representative who has indicated his/her understanding and acceptance.     Plan Discussed with:   Anesthesia Plan Comments:         Anesthesia Quick Evaluation

## 2015-05-25 NOTE — H&P (Signed)
Mackenzie Carr 17-Nov-1961  [id number].   Primary Care MD: Dr. Merri Brunette Chief Complaint/Reason for Consult:  Acute appendicitis HPI: This is a 54 yo black female who began having right sided abdominal pain on Tuesday.  She had some nausea, but not emesis.  She started having diarrhea too, but felt her pain and diarrhea may be secondary to her IBS.  Her pain persisted on Wednesday and she went to urgent care.  Her WBC was normal so she was not sent for a CT scan that night.  The following day she called her PCP who sent her for a CT scan.  She was found to have acute appendicitis on her CT scan and was referred to [facility] where we are admitting her.    ROS : Please see HPI, otherwise all other systems have been reviewed and are negative  Family History  Problem Relation Age of Onset  . Diabetes Mother   . Cancer Father   . Cancer Maternal Grandfather   . Cancer Paternal Grandfather   . Diabetes Sister     Past Medical History  Diagnosis Date  . Hyperlipidemia   . Hypertension   . GERD (gastroesophageal reflux disease)   . IBS (irritable bowel syndrome)   . TIA (transient ischemic attack)     MRI 02/03/10 normal. Echo 3/7/11normal EF,normal. Carotid doppler 02/05/10 normal. Cardionet moniitor-no AFIB  . Migraines     Past Surgical History  Procedure Laterality Date  . Colonoscopy  09/2011    hyperplastic polyps,diverticula,repeat 10 years-Dr Magod  . Bladder tacking    . Bunionectomy Bilateral     Social History:  reports that she has never smoked. She does not have any smokeless tobacco history on file. She reports that she does not drink alcohol or use illicit drugs.  Allergies:  Allergies  Allergen Reactions  . Other Nausea And Vomiting and Other (See Comments)    Dairy products - eggs, cheese, milk   . Sulfa Antibiotics Other (See Comments)    THRUSH    Medications Prior to Admission  Medication Sig Dispense Refill  . aspirin 325 MG tablet Take 325 mg by mouth  at bedtime.     . fexofenadine (ALLEGRA) 180 MG tablet Take 180 mg by mouth daily as needed for allergies.     Marland Kitchen lansoprazole (PREVACID) 30 MG capsule Take 30 mg by mouth daily.    Marland Kitchen lisinopril (PRINIVIL,ZESTRIL) 10 MG tablet Take 10 mg by mouth daily.    . traMADol (ULTRAM) 50 MG tablet Take 50 mg by mouth as needed.    . triamterene-hydrochlorothiazide (MAXZIDE) 75-50 MG per tablet Take 1 tablet by mouth daily.      Blood pressure 138/90, pulse 76, temperature 97.8 F (36.6 C), temperature source Oral, resp. rate 16, height  (1.575 m), weight 70.931 kg (156 lb 6 oz), SpO2 100 %. Physical Exam: General: pleasant, WD, WN black female who is sitting up in a chair in NAD HEENT: head is normocephalic, atraumatic.  Sclera are noninjected.  PERRL.  Ears and nose without any masses or lesions.  Mouth is pink and moist Heart: regular, rate, and rhythm.  Normal s1,s2. No obvious murmurs, gallops, or rubs noted.  Palpable radial and pedal pulses bilaterally Lungs: CTAB, no wheezes, rhonchi, or rales noted.  Respiratory effort nonlabored Abd: soft, mild diffuse tenderness, but greatest in RLQ, ND, +BS, no masses, hernias, or organomegaly MS: all 4 extremities are symmetrical with no cyanosis, clubbing, or edema. Skin: warm and  dry with no masses, lesions, or rashes Psych: A&Ox3 with an appropriate affect.    No results found for this or any previous visit (from the past 48 hour(s)). Ct Abdomen Pelvis W Contrast  05/25/2015   CLINICAL DATA:  Three-day history of right lower quadrant pain with nausea and diarrhea  EXAM: CT ABDOMEN AND PELVIS WITH CONTRAST  TECHNIQUE: Multidetector CT imaging of the abdomen and pelvis was performed using the standard protocol following bolus administration of intravenous contrast.  CONTRAST:  ISOVUE-300 IOPAMIDOL (ISOVUE-300) INJECTION 61%  COMPARISON:  None.  FINDINGS: There is slight bibasilar lung atelectatic change. Lung bases are otherwise clear.  Liver is  prominent, measuring 18.8 cm in length. 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 No focal liver lesions are identified. The gallbladder wall is not appreciably thickened. There is no biliary duct dilatation.  Spleen, pancreas, and adrenals appear normal.  There is a 5 mm cyst arising from the lateral mid right kidney. There is no hydronephrosis on either side. There is no renal or ureteral calculus on either side.  In the pelvis, the urinary bladder is midline with normal wall thickness. Uterus is absent. There is no pelvic mass or pelvic fluid collection.  There is thickening of the midportion of the appendix with a transverse diameter of 11 mm. In this region, the wall of the appendix is ill-defined and enhances. There is subtle surrounding mesenteric thickening. These are changes felt to be indicative of early acute appendiceal inflammation. There is no evidence of abscess. The proximal and distal most aspects of the appendix appear within normal limits.  There is no bowel obstruction.  No free air or portal venous air.  There is no ascites, adenopathy, or abscess in the abdomen or pelvis. There is atherosclerotic change in the aorta but no aneurysm. There are no blastic or lytic bone lesions. There is degenerative change in the lower lumbar spine.  IMPRESSION: Evidence suggesting early acute appendiceal inflammation in the midportion of the appendix with localized dilatation, wall enhancement, and loss of sharp contour of the appendiceal wall in this area. There is subtle minimal mesenteric thickening in this area. The proximal and distal aspects of the appendix appear normal.  No abscess. No bowel obstruction. No renal or ureteral calculus. No hydronephrosis.  Liver is prominent without focal lesion.  Uterus absent.  Critical Value/emergent results were called by telephone at the time of interpretation on 05/25/2015 at 12:45 pm to Dr. Merri Brunette , who verbally acknowledged these results.   Electronically Signed    By: Bretta Bang III M.D.   On: 05/25/2015 12:45       Assessment/Plan 1. Acute appendicitis -admit and plan for OR this afternoon for lap appy.  I have d/w the patient the procedure, along with risks, complications, and expected outcomes.  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  She understands and is agreeable to proceed. -cefoxitin is written for. -npo   Luretta Everly E 05/25/2015, 2:22 PM Pager: [phone]

## 2015-05-26 ENCOUNTER — Ambulatory Visit: Payer: Self-pay | Admitting: Podiatry

## 2015-05-26 DIAGNOSIS — K3580 Unspecified acute appendicitis: Secondary | ICD-10-CM | POA: Diagnosis not present

## 2015-05-26 MED ORDER — HYDROCODONE-ACETAMINOPHEN 5-325 MG PO TABS: 1.0000 | ORAL_TABLET | ORAL | Status: DC | PRN

## 2015-05-26 NOTE — Progress Notes (Signed)
Patients vitals WNL with no complaints of pain. Discussed discharge instructions with both patient and husband. Both had no questions nor concerns. Incision sites without signs and symptoms of infection. Patient discharged to home.

## 2015-05-26 NOTE — Discharge Summary (Signed)
Patient ID: Mackenzie Carr MRN: [id number] DOB/AGE: [birth year] 54 y.o.  Admit date: 05/25/2015 Discharge date: 05/26/2015  Procedures: lap appy  Consults: None  Reason for Admission: This is a 54 yo black female who began having right sided abdominal pain on Tuesday. She had some nausea, but not emesis. She started having diarrhea too, but felt her pain and diarrhea may be secondary to her IBS. Her pain persisted on Wednesday and she went to urgent care. Her WBC was normal so she was not sent for a CT scan that night. The following day she called her PCP who sent her for a CT scan. She was found to have acute appendicitis on her CT scan and was referred to [facility] where we are admitting her.  Admission Diagnoses:  1. Acute appendicitis 2. HTN 3. H/o TIA  Hospital Course: The patient was admitted and taken to teh OR where she underwent a lap appy.  She tolerated this well.  On POD 1, she was tolerating a regular diet and her pain was eventually well controlled.  She was stable for dc home later that day.  Discharge Diagnoses:  Principal Problem:   Appendicitis, acute s/p lap appendectomy 05/26/15   Discharge Medications:   Medication List    TAKE these medications        aspirin 325 MG tablet  Take 325 mg by mouth at bedtime.     fexofenadine 180 MG tablet  Commonly known as:  ALLEGRA  Take 180 mg by mouth daily as needed for allergies.     HYDROcodone-acetaminophen 5-325 MG per tablet  Commonly known as:  NORCO/VICODIN  Take 1-2 tablets by mouth every 4 (four) hours as needed for moderate pain.     ibuprofen 200 MG tablet  Commonly known as:  ADVIL,MOTRIN  Take 200-400 mg by mouth every 6 (six) hours as needed (For pain.).     lansoprazole 30 MG capsule  Commonly known as:  PREVACID  Take 30 mg by mouth daily as needed (For heartburn or acid reflux.).     lisinopril 10 MG tablet  Commonly known as:  PRINIVIL,ZESTRIL  Take 10 mg by mouth daily.     triamterene-hydrochlorothiazide 75-50 MG per tablet  Commonly known as:  MAXZIDE  Take 1 tablet by mouth daily.        Discharge Instructions:     Follow-up Information    Follow up with [facility] On 06/13/2015.   Specialty:  General Surgery   Why:  [facility] the [facility], 3:45pm, arrive no later than 3:15pm for paperwork   Contact information:   [address] [place] [address] [phone]       Signed: Letha Cape 05/26/2015, 9:57 AM

## 2015-05-26 NOTE — Discharge Instructions (Signed)
[facility] ______[facility] LAPAROSCOPIC APPENDECTOMY SURGERY: POST OP INSTRUCTIONS Always review your discharge instruction sheet given to you by the facility where your surgery was performed. IF YOU HAVE DISABILITY OR FAMILY LEAVE FORMS, YOU MUST BRING THEM TO THE OFFICE FOR PROCESSING.   DO NOT GIVE THEM TO YOUR DOCTOR.  1. A prescription for pain medication may be given to you upon discharge.  Take your pain medication as prescribed, if needed.  If narcotic pain medicine is not needed, then you may take acetaminophen (Tylenol) or ibuprofen (Advil) as needed. 2. Take your usually prescribed medications unless otherwise directed. 3. If you need a refill on your pain medication, please contact your pharmacy.  They will contact our office to request authorization. Prescriptions will not be filled after 5pm or on week-ends. 4. You should follow a light diet.  Be sure to include lots of fluids daily. 5. Most patients will experience some swelling and bruising in the area of the incisions.  Ice packs will help.  Swelling and bruising can take several days to resolve.  6. It is common to experience some constipation if taking pain medication after surgery.  Increasing fluid intake and taking a stool softener (such as Colace) will usually help or prevent this problem from occurring.  A mild laxative (Milk of Magnesia or Miralax) should be taken according to package instructions if there are no bowel movements after 48 hours. 7. Unless discharge instructions indicate otherwise, you may remove your bandages 72 hours after surgery;  may shower.  You may have steri-strips (small skin tapes) in place directly over the incision.  These strips should be left on the skin for 14 days.  If your surgeon used skin glue on the incision, you may shower in 24 hours.  The glue will flake off over the next 2-3 weeks.  Any sutures or staples will be removed at the office during your follow-up  visit. 8. ACTIVITIES:  You may resume regular (light) daily activities beginning the next day--such as daily self-care, walking, climbing stairs--gradually increasing activities as tolerated.  You may have sexual intercourse when it is comfortable.  Refrain from any heavy lifting or straining-nothing over 10 pounds for 2 weeks.  a. You may drive when you are no longer taking prescription pain medication, you can comfortably wear a seatbelt, and you can safely maneuver your car and apply brakes. b. RETURN TO WORK:  Desk type work in one week, full duty in 2-4 weeks when pain-free.__________________________________________________________ 9. You should see your doctor in the office for a follow-up appointment approximately 2-3 weeks after your surgery.  Make sure that you call for this appointment within a day or two after you arrive home to insure a convenient appointment time. 10. OTHER INSTRUCTIONS: __________________________________________________________________________________________________________________________ __________________________________________________________________________________________________________________________ WHEN TO CALL YOUR DOCTOR: 1. Fever over 101.0 2. Inability to urinate 3. Continued bleeding from incision. 4. Increased pain, redness, or drainage from the incision. 5. Increasing abdominal pain  The clinic staff is available to answer your questions during regular business hours.  Please dont hesitate to call and ask to speak to one of the nurses for clinical concerns.  If you have a medical emergency, go to the nearest emergency room or call 911.  A surgeon from [facility] is always on call at the hospital. [address], [address], [place], [place]  [address] ? [address] [address], [place], [place]   [address] [phone] ? [phone] ? FAX [phone] Web site: [email or web address]

## 2015-05-26 NOTE — Progress Notes (Signed)
1 Day Post-Op  Subjective: Very sore especially with movement.  Ate some solid food.  Objective: Vital signs in last 24 hours: Temp:  [97.3 F (36.3 C)-98.5 F (36.9 C)] 97.9 F (36.6 C) (06/24 0553) Pulse Rate:  [58-87] 63 (06/24 0553) Resp:  [11-23] 16 (06/24 0553) BP: (99-138)/(55-90) 99/55 mmHg (06/24 0553) SpO2:  [94 %-100 %] 94 % (06/24 0553) Weight:  [70.931 kg (156 lb 6 oz)] 70.931 kg (156 lb 6 oz) (06/23 1405)    Intake/Output from previous day: 06/23 0701 - 06/24 0700 In: 2183.3 [I.V.:2183.3] Out: 1220 [Urine:1220] Intake/Output this shift:    PE: General- In NAD Abdomen-soft, dressings dry  Lab Results:   Recent Labs  05/25/15 1450  WBC 6.5  HGB 11.8*  HCT 37.3  PLT 253   BMET  Recent Labs  05/25/15 1450  NA 138  K 3.8  CL 100*  CO2 29  GLUCOSE 85  BUN 13  CREATININE 0.87  CALCIUM 9.0   PT/INR No results for input(s): LABPROT, INR in the last 72 hours. Comprehensive Metabolic Panel:    Component Value Date/Time   NA 138 05/25/2015 1450   NA 135 06/23/2010 1413   K 3.8 05/25/2015 1450   K 4.1 06/23/2010 1413   CL 100* 05/25/2015 1450   CL 104 06/23/2010 1413   CO2 29 05/25/2015 1450   CO2 26 06/23/2010 1413   BUN 13 05/25/2015 1450   BUN 13 06/23/2010 1413   CREATININE 0.87 05/25/2015 1450   CREATININE 0.88 06/23/2010 1413   GLUCOSE 85 05/25/2015 1450   GLUCOSE 100* 06/23/2010 1413   CALCIUM 9.0 05/25/2015 1450   CALCIUM 8.8 06/23/2010 1413   AST 18 06/23/2010 1413   AST 18 02/03/2010 1707   ALT 12 06/23/2010 1413   ALT 12 02/03/2010 1707   ALKPHOS 39 06/23/2010 1413   ALKPHOS 46 02/03/2010 1707   BILITOT 0.6 06/23/2010 1413   BILITOT 0.6 02/03/2010 1707   PROT 7.7 06/23/2010 1413   PROT 7.6 02/03/2010 1707   ALBUMIN 3.7 06/23/2010 1413   ALBUMIN 3.7 02/03/2010 1707     Studies/Results: Ct Abdomen Pelvis W Contrast  05/25/2015   CLINICAL DATA:  Three-day history of right lower quadrant pain with nausea and diarrhea   EXAM: CT ABDOMEN AND PELVIS WITH CONTRAST  TECHNIQUE: Multidetector CT imaging of the abdomen and pelvis was performed using the standard protocol following bolus administration of intravenous contrast.  CONTRAST:  ISOVUE-300 IOPAMIDOL (ISOVUE-300) INJECTION 61%  COMPARISON:  None.  FINDINGS: There is slight bibasilar lung atelectatic change. Lung bases are otherwise clear.  Liver is prominent, measuring 18.8 cm in length. 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 No focal liver lesions are identified. The gallbladder wall is not appreciably thickened. There is no biliary duct dilatation.  Spleen, pancreas, and adrenals appear normal.  There is a 5 mm cyst arising from the lateral mid right kidney. There is no hydronephrosis on either side. There is no renal or ureteral calculus on either side.  In the pelvis, the urinary bladder is midline with normal wall thickness. Uterus is absent. There is no pelvic mass or pelvic fluid collection.  There is thickening of the midportion of the appendix with a transverse diameter of 11 mm. In this region, the wall of the appendix is ill-defined and enhances. There is subtle surrounding mesenteric thickening. These are changes felt to be indicative of early acute appendiceal inflammation. There is no evidence of abscess. The proximal and  distal most aspects of the appendix appear within normal limits.  There is no bowel obstruction.  No free air or portal venous air.  There is no ascites, adenopathy, or abscess in the abdomen or pelvis. There is atherosclerotic change in the aorta but no aneurysm. There are no blastic or lytic bone lesions. There is degenerative change in the lower lumbar spine.  IMPRESSION: Evidence suggesting early acute appendiceal inflammation in the midportion of the appendix with localized dilatation, wall enhancement, and loss of sharp contour of the appendiceal wall in this area. There is subtle minimal mesenteric thickening in this area. The proximal  and distal aspects of the appendix appear normal.  No abscess. No bowel obstruction. No renal or ureteral calculus. No hydronephrosis.  Liver is prominent without focal lesion.  Uterus absent.  Critical Value/emergent results were called by telephone at the time of interpretation on 05/25/2015 at 12:45 pm to Dr. Merri Brunette , who verbally acknowledged these results.   Electronically Signed   By: Bretta Bang III M.D.   On: 05/25/2015 12:45    Anti-infectives: Anti-infectives    Start     Dose/Rate Route Frequency Ordered Stop   05/25/15 2200  cefOXitin (MEFOXIN) 1 g in dextrose 5 % 50 mL IVPB     1 g 100 mL/hr over 30 Minutes Intravenous Every 6 hours 05/25/15 1850 05/26/15 1559   05/25/15 1413  cefOXitin (MEFOXIN) 2 g in dextrose 5 % 50 mL IVPB     2 g 100 mL/hr over 30 Minutes Intravenous 30 min pre-op 05/25/15 1410 05/25/15 1638      Assessment Principal Problem:   Appendicitis, acute s/p lap appendectomy 05/26/15-stable overnight; still quite sore.    LOS: 1 day   Plan: If she feels better this afternoon will discharge to home.  We discussed discharge instructions.   Mackenzie Carr Mackenzie Carr 05/26/2015

## 2015-05-29 ENCOUNTER — Encounter (HOSPITAL_COMMUNITY): Payer: Self-pay | Admitting: General Surgery

## 2015-06-28 ENCOUNTER — Other Ambulatory Visit: Payer: Self-pay | Admitting: Obstetrics and Gynecology

## 2015-06-29 LAB — CYTOLOGY - PAP

## 2016-07-04 ENCOUNTER — Emergency Department (HOSPITAL_BASED_OUTPATIENT_CLINIC_OR_DEPARTMENT_OTHER)
Admission: EM | Admit: 2016-07-04 | Discharge: 2016-07-04 | Disposition: A | Discharge status: Home / Self Care | Payer: 59 | Attending: Physician Assistant | Admitting: Physician Assistant

## 2016-07-04 ENCOUNTER — Encounter (HOSPITAL_BASED_OUTPATIENT_CLINIC_OR_DEPARTMENT_OTHER): Payer: Self-pay | Admitting: Emergency Medicine

## 2016-07-04 DIAGNOSIS — R42 Dizziness and giddiness: Secondary | ICD-10-CM | POA: Diagnosis present

## 2016-07-04 DIAGNOSIS — I10 Essential (primary) hypertension: Secondary | ICD-10-CM | POA: Insufficient documentation

## 2016-07-04 DIAGNOSIS — R51 Headache: Secondary | ICD-10-CM | POA: Insufficient documentation

## 2016-07-04 LAB — BASIC METABOLIC PANEL
Anion gap: 7 (ref 5–15)
BUN: 17 mg/dL (ref 6–20)
CO2: 27 mmol/L (ref 22–32)
Calcium: 9 mg/dL (ref 8.9–10.3)
Chloride: 105 mmol/L (ref 101–111)
Creatinine, Ser: 0.92 mg/dL (ref 0.44–1.00)
GFR calc Af Amer: >60 mL/min (ref >60)
Glucose, Bld: 100 mg/dL — ABNORMAL HIGH (ref 65–99)
POTASSIUM: 3.2 mmol/L — AB (ref 3.5–5.1)
Sodium: 139 mmol/L (ref 135–145)

## 2016-07-04 LAB — CBC WITH DIFFERENTIAL/PLATELET
Basophils Absolute: 0 10*3/uL (ref 0.0–0.1)
Basophils Relative: 0 %
EOS PCT: 3 %
Eosinophils Absolute: 0.2 10*3/uL (ref 0.0–0.7)
HCT: 36.5 % (ref 36.0–46.0)
Hemoglobin: 11.8 g/dL — ABNORMAL LOW (ref 12.0–15.0)
LYMPHS ABS: 1.6 10*3/uL (ref 0.7–4.0)
Lymphocytes Relative: 28 %
MCH: 26.9 pg (ref 26.0–34.0)
MCHC: 32.3 g/dL (ref 30.0–36.0)
MCV: 83.3 fL (ref 78.0–100.0)
MONO ABS: 0.4 10*3/uL (ref 0.1–1.0)
Monocytes Relative: 7 %
NEUTROS ABS: 3.6 10*3/uL (ref 1.7–7.7)
Neutrophils Relative %: 62 %
PLATELETS: 300 10*3/uL (ref 150–400)
RBC: 4.38 MIL/uL (ref 3.87–5.11)
RDW: 13.8 % (ref 11.5–15.5)
WBC: 5.8 10*3/uL (ref 4.0–10.5)

## 2016-07-04 MED ORDER — POTASSIUM CHLORIDE CRYS ER 20 MEQ PO TBCR
40.0000 meq | EXTENDED_RELEASE_TABLET | Freq: Once | ORAL | Status: AC
  Administered 2016-07-04: 40 meq via ORAL
  Filled 2016-07-04: qty 2

## 2016-07-04 NOTE — ED Provider Notes (Signed)
[facility]   CSN: [id number] Arrival date & time: 07/04/16  0277  First Provider Contact:  None       History   Chief Complaint Chief Complaint  Patient presents with  . Dizziness    HPI Mackenzie Carr is a 55 y.o. female.  Patient presents today with a chief complaint of dizziness.  She describes the dizziness as feeling lightheaded.  Dizziness has been intermittent over the past 9 days, but was worse this morning.  However, she states that she does not have any dizziness at this time.  She reports that she becomes lightheaded when standing for long period of time.  She also reports that she has been having Migraine Headaches intermittently over the past 9 days, but does not have a headache at this time.  She states that her headache is consistent with her previous headaches.  She states that she has had sinus pain and congestion over the past 2 days.  She denies fever, chills, syncope, vision changes, or any other symptoms at this time.      Past Medical History:  Diagnosis Date  . GERD (gastroesophageal reflux disease)   . Hyperlipidemia   . Hypertension   . IBS (irritable bowel syndrome)   . Migraines   . TIA (transient ischemic attack)    MRI 02/03/10 normal. Echo 3/7/11normal EF,normal. Carotid doppler 02/05/10 normal. Cardionet moniitor-no AFIB    Patient Active Problem List   Diagnosis Date Noted  . Acute appendicitis 05/25/2015  . Appendicitis, acute s/p lap appendectomy 05/26/15 05/25/2015  . Essential hypertension, benign 11/26/2013  . HTN (hypertension) 11/26/2013  . GERD (gastroesophageal reflux disease) 11/26/2013  . History of TIA (transient ischemic attack) 11/26/2013    Past Surgical History:  Procedure Laterality Date  . bladder tacking    . BUNIONECTOMY Bilateral   . COLONOSCOPY  09/2011   hyperplastic polyps,diverticula,repeat 10 years-Dr Magod  . LAPAROSCOPIC APPENDECTOMY N/A 05/25/2015   Procedure: APPENDECTOMY  LAPAROSCOPIC;  Surgeon: Avel Peace, MD;  Location: [facility];  Service: General;  Laterality: N/A;    OB History    No data available       Home Medications    Prior to Admission medications   Medication Sig Start Date End Date Taking? Authorizing Provider  aspirin 325 MG tablet Take 325 mg by mouth at bedtime.     Historical Provider, MD  fexofenadine (ALLEGRA) 180 MG tablet Take 180 mg by mouth daily as needed for allergies.     Historical Provider, MD  HYDROcodone-acetaminophen (NORCO/VICODIN) 5-325 MG per tablet Take 1-2 tablets by mouth every 4 (four) hours as needed for moderate pain. 05/26/15   Barnetta Chapel, PA-C  ibuprofen (ADVIL,MOTRIN) 200 MG tablet Take 200-400 mg by mouth every 6 (six) hours as needed (For pain.).    Historical Provider, MD  lansoprazole (PREVACID) 30 MG capsule Take 30 mg by mouth daily as needed (For heartburn or acid reflux.).  11/13/13   Historical Provider, MD  lisinopril (PRINIVIL,ZESTRIL) 10 MG tablet Take 10 mg by mouth daily.    Historical Provider, MD  triamterene-hydrochlorothiazide (MAXZIDE) 75-50 MG per tablet Take 1 tablet by mouth daily.    Historical Provider, MD    Family History Family History  Problem Relation Age of Onset  . Diabetes Mother   . Cancer Father   . Cancer Maternal Grandfather   . Cancer Paternal Grandfather   . Diabetes Sister     Social History Social History  Substance Use  Topics  . Smoking status: Never Smoker  . Smokeless tobacco: Never Used  . Alcohol use No     Allergies   Other and Sulfa antibiotics   Review of Systems Review of Systems  All other systems reviewed and are negative.    Physical Exam Updated Vital Signs BP 176/98 (BP Location: Left Arm)   Pulse 70   Temp 98.6 F (37 C) (Oral)   Resp 18   Ht $RemoveBefor m)   Wt 68 kg   SpO2 100%   BMI 27.44 kg/m   Physical Exam  Constitutional: She is oriented to person, place, and time. She appears well-developed and well-nourished.   HENT:  Head: Normocephalic and atraumatic.  Right Ear: Tympanic membrane and ear canal normal.  Left Ear: Tympanic membrane and ear canal normal.  Mouth/Throat: Oropharynx is clear and moist.  Eyes: EOM are normal. Pupils are equal, round, and reactive to light.  Neck: Normal range of motion. Neck supple.  Cardiovascular: Normal rate and regular rhythm.   Pulmonary/Chest: Effort normal and breath sounds normal.  Musculoskeletal: Normal range of motion.  Neurological: She is alert and oriented to person, place, and time. She has normal strength. She displays normal reflexes. No cranial nerve deficit or sensory deficit. Coordination normal.  Normal gait, no ataxia Normal finger to nose testing, no dysmetria  Skin: Skin is warm and dry. No rash noted.  Psychiatric: She has a normal mood and affect.  Nursing note and vitals reviewed.    ED Treatments / Results  Labs (all labs ordered are listed, but only abnormal results are displayed) Labs Reviewed  CBC WITH DIFFERENTIAL/PLATELET  BASIC METABOLIC PANEL    EKG  EKG Interpretation None       Radiology No results found.  Procedures Procedures (including critical care time)  Medications Ordered in ED Medications - No data to display   Initial Impression / Assessment and Plan / ED Course  I have reviewed the triage vital signs and the nursing notes.  Pertinent labs & imaging results that were available during my care of the patient were reviewed by me and considered in my medical decision making (see chart for details).  Pt presents today with dizziness and headache intermittently over the past 9 days.  However, she does have the headache or dizziness during my evaluation.  She has a normal neurological exam.  Normal gait, no ataxia.  Therefore, do not feel that any imaging is indicated.  Labs unremarkable aside from mild hypokalemia.  Patient given oral Potassium in the ED.  Feel that she is stable for discharge.  Return  precautions given.  Patient also evaluated by Dr Corlis Leak who is in agreement with the plan.     Clinical Course     Final Clinical Impressions(s) / ED Diagnoses   Final diagnoses:  None    New Prescriptions New Prescriptions   No medications on file     Santiago Glad, PA-C 07/05/16 2125    Courteney Lyn Mackuen, MD 07/06/16 Rickey Primus

## 2016-07-04 NOTE — ED Triage Notes (Signed)
Pt having dizziness today.  Pt states last week she had cold symptoms, with increased BP and fever.

## 2016-07-05 ENCOUNTER — Emergency Department (HOSPITAL_BASED_OUTPATIENT_CLINIC_OR_DEPARTMENT_OTHER)
Admission: EM | Admit: 2016-07-05 | Discharge: 2016-07-05 | Disposition: A | Discharge status: Home / Self Care | Payer: 59 | Attending: Emergency Medicine | Admitting: Emergency Medicine

## 2016-07-05 ENCOUNTER — Encounter (HOSPITAL_BASED_OUTPATIENT_CLINIC_OR_DEPARTMENT_OTHER): Payer: Self-pay

## 2016-07-05 ENCOUNTER — Emergency Department (HOSPITAL_BASED_OUTPATIENT_CLINIC_OR_DEPARTMENT_OTHER): Payer: 59

## 2016-07-05 DIAGNOSIS — G8929 Other chronic pain: Secondary | ICD-10-CM

## 2016-07-05 DIAGNOSIS — R51 Headache: Secondary | ICD-10-CM | POA: Diagnosis not present

## 2016-07-05 DIAGNOSIS — Z8673 Personal history of transient ischemic attack (TIA), and cerebral infarction without residual deficits: Secondary | ICD-10-CM | POA: Diagnosis not present

## 2016-07-05 DIAGNOSIS — R0602 Shortness of breath: Secondary | ICD-10-CM | POA: Diagnosis not present

## 2016-07-05 DIAGNOSIS — M791 Myalgia: Secondary | ICD-10-CM | POA: Diagnosis not present

## 2016-07-05 DIAGNOSIS — R509 Fever, unspecified: Secondary | ICD-10-CM | POA: Diagnosis not present

## 2016-07-05 DIAGNOSIS — Z79899 Other long term (current) drug therapy: Secondary | ICD-10-CM | POA: Diagnosis not present

## 2016-07-05 DIAGNOSIS — R197 Diarrhea, unspecified: Secondary | ICD-10-CM | POA: Diagnosis not present

## 2016-07-05 DIAGNOSIS — Z7982 Long term (current) use of aspirin: Secondary | ICD-10-CM | POA: Diagnosis not present

## 2016-07-05 DIAGNOSIS — I10 Essential (primary) hypertension: Secondary | ICD-10-CM | POA: Insufficient documentation

## 2016-07-05 MED ORDER — SODIUM CHLORIDE 0.9 % IV BOLUS (SEPSIS): 500.0000 mL | Freq: Once | INTRAVENOUS | Status: DC

## 2016-07-05 MED ORDER — METOCLOPRAMIDE HCL 5 MG/ML IJ SOLN: 10.0000 mg | Freq: Once | INTRAMUSCULAR | Status: DC

## 2016-07-05 MED ORDER — DIPHENHYDRAMINE HCL 50 MG/ML IJ SOLN: 25.0000 mg | Freq: Once | INTRAMUSCULAR | Status: DC

## 2016-07-05 MED ORDER — DEXAMETHASONE SODIUM PHOSPHATE 10 MG/ML IJ SOLN: 10.0000 mg | Freq: Once | INTRAMUSCULAR | Status: DC

## 2016-07-05 NOTE — ED Notes (Signed)
Patient notified she would receive head CT.  Offered IV medication to patient.  Patient states "I don't want anything that's going to mask this headache.  I just want to find out what's going on".

## 2016-07-05 NOTE — ED Notes (Signed)
Patient refusing IV medication and IV at present.  Patient requesting head CT-concerned of sinusitis. MD made aware.

## 2016-07-05 NOTE — ED Triage Notes (Signed)
C/o HA since last tuesday-was seen here yesterday for same-NAD-steady gait

## 2016-07-05 NOTE — ED Provider Notes (Signed)
[facility]   CSN: [id number] Arrival date & time: 07/05/16  1158  First Provider Contact:  First MD Initiated Contact with Patient 07/05/16 1241        History   Chief Complaint Chief Complaint  Patient presents with  . Headache    HPI Mackenzie Carr is a 55 y.o. female.  Mackenzie Carr is a 55 yo F with pmhx of chronic migraines for years who presents today with HA x 1 week. Patient was seen yesterday for similar complaint. Neuro exam was normal at that time and patient was given IVFs. She returns today for persistent headache. She says for the past week, her headache has been constant and different that her normal headaches. She describes frontal, bilateral headaches but also a tightness that wraps around her head like a band. She also endorses SOB, lightheadedness, dizziness, chills, and diffuse myalgias. She denies facial droop, visual disturbances, and slurred speech. She reports fevers at home to 99.9 and well as some abdominal pain and loose stools, however feels this is consistent with her IBS flairs. She takes ibuprofen for her headaches which has not provided her with relief recently.       Past Medical History:  Diagnosis Date  . GERD (gastroesophageal reflux disease)   . Hyperlipidemia   . Hypertension   . IBS (irritable bowel syndrome)   . Migraines   . TIA (transient ischemic attack)    MRI 02/03/10 normal. Echo 3/7/11normal EF,normal. Carotid doppler 02/05/10 normal. Cardionet moniitor-no AFIB    Patient Active Problem List   Diagnosis Date Noted  . Acute appendicitis 05/25/2015  . Appendicitis, acute s/p lap appendectomy 05/26/15 05/25/2015  . Essential hypertension, benign 11/26/2013  . HTN (hypertension) 11/26/2013  . GERD (gastroesophageal reflux disease) 11/26/2013  . History of TIA (transient ischemic attack) 11/26/2013    Past Surgical History:  Procedure Laterality Date  . ABDOMINAL HYSTERECTOMY    . bladder  tacking    . BUNIONECTOMY Bilateral   . COLONOSCOPY  09/2011   hyperplastic polyps,diverticula,repeat 10 years-Dr Magod  . LAPAROSCOPIC APPENDECTOMY N/A 05/25/2015   Procedure: APPENDECTOMY LAPAROSCOPIC;  Surgeon: Avel Peace, MD;  Location: [facility];  Service: General;  Laterality: N/A;    OB History    No data available       Home Medications    Prior to Admission medications   Medication Sig Start Date End Date Taking? Authorizing Provider  aspirin 325 MG tablet Take 325 mg by mouth at bedtime.     Historical Provider, MD  fexofenadine (ALLEGRA) 180 MG tablet Take 180 mg by mouth daily as needed for allergies.     Historical Provider, MD  HYDROcodone-acetaminophen (NORCO/VICODIN) 5-325 MG per tablet Take 1-2 tablets by mouth every 4 (four) hours as needed for moderate pain. 05/26/15   Barnetta Chapel, PA-C  ibuprofen (ADVIL,MOTRIN) 200 MG tablet Take 200-400 mg by mouth every 6 (six) hours as needed (For pain.).    Historical Provider, MD  lansoprazole (PREVACID) 30 MG capsule Take 30 mg by mouth daily as needed (For heartburn or acid reflux.).  11/13/13   Historical Provider, MD  lisinopril (PRINIVIL,ZESTRIL) 10 MG tablet Take 10 mg by mouth daily.    Historical Provider, MD  triamterene-hydrochlorothiazide (MAXZIDE) 75-50 MG per tablet Take 1 tablet by mouth daily.    Historical Provider, MD    Family History Family History  Problem Relation Age of Onset  . Diabetes Mother   . Cancer Father   .  Cancer Maternal Grandfather   . Cancer Paternal Grandfather   . Diabetes Sister     Social History Social History  Substance Use Topics  . Smoking status: Never Smoker  . Smokeless tobacco: Never Used  . Alcohol use No     Allergies   Other and Sulfa antibiotics   Review of Systems Review of Systems  Constitutional: Positive for chills and fever. Negative for activity change, appetite change, diaphoresis and unexpected weight change.  Eyes: Negative.   Respiratory:  Positive for shortness of breath. Negative for cough, chest tightness and wheezing.   Cardiovascular: Negative for chest pain and palpitations.  Gastrointestinal: Positive for diarrhea. Negative for abdominal pain, nausea and vomiting.  Endocrine: Negative.   Genitourinary: Negative.   Musculoskeletal: Positive for myalgias. Negative for neck pain and neck stiffness.  Skin: Negative.   Allergic/Immunologic: Negative.   Neurological: Positive for dizziness, light-headedness and headaches. Negative for seizures, syncope, weakness and numbness.  Hematological: Negative.   Psychiatric/Behavioral: Negative.      Physical Exam Updated Vital Signs BP 153/89   Pulse 63   Temp 98.1 F (36.7 C) (Oral)   Resp 20   Ht 5' 2.5" (1.588 m)   Wt 68 kg   SpO2 100%   BMI 27.00 kg/m   Physical Exam  Constitutional: She is oriented to person, place, and time. She appears well-developed and well-nourished. No distress.  HENT:  Head: Normocephalic and atraumatic.  Eyes: Conjunctivae are normal.  Neck: Neck supple.  Cardiovascular: Normal rate, regular rhythm and normal heart sounds.   No murmur heard. Pulmonary/Chest: Effort normal and breath sounds normal. No respiratory distress.  Abdominal: Soft. Bowel sounds are normal. She exhibits no distension. There is no tenderness.  Musculoskeletal: She exhibits no edema.  Neurological: She is alert and oriented to person, place, and time. She displays normal reflexes. No cranial nerve deficit. She exhibits normal muscle tone. Coordination normal.  Skin: Skin is warm and dry.  Psychiatric: She has a normal mood and affect.  Nursing note and vitals reviewed.    ED Treatments / Results  Labs (all labs ordered are listed, but only abnormal results are displayed) Labs Reviewed - No data to display  EKG  EKG Interpretation None       Radiology Ct Head Wo Contrast  Result Date: 07/05/2016 CLINICAL DATA:  Headaches for 2 weeks. Dizziness.  Lightheadedness. Sensitivity to light. Nausea. EXAM: CT HEAD WITHOUT CONTRAST TECHNIQUE: Contiguous axial images were obtained from the base of the skull through the vertex without intravenous contrast. COMPARISON:  CT head without contrast 11/12/2010 FINDINGS: No acute cortical infarct, hemorrhage, or mass lesion is present. The ventricles are of normal size. No significant extra-axial fluid collection is evident. The paranasal sinuses and mastoid air cells are clear. The calvarium is intact. No significant extracranial soft tissue lesion is present. The globes and orbits are intact. IMPRESSION: Negative CT of the head. Electronically Signed   By: Marin Roberts M.D.   On: 07/05/2016 14:12    Procedures Procedures (including critical care time)  Medications Ordered in ED Medications  metoCLOPramide (REGLAN) injection 10 mg (10 mg Intravenous Not Given 07/05/16 1412)  diphenhydrAMINE (BENADRYL) injection 25 mg (25 mg Intravenous Not Given 07/05/16 1412)  dexamethasone (DECADRON) injection 10 mg (10 mg Intravenous Not Given 07/05/16 1412)  sodium chloride 0.9 % bolus 500 mL (500 mLs Intravenous Not Given 07/05/16 1412)     Initial Impression / Assessment and Plan / ED Course  I have reviewed  the triage vital signs and the nursing notes.  Pertinent labs & imaging results that were available during my care of the patient were reviewed by me and considered in my medical decision making (see chart for details).  Clinical Course    Headache: Patient refused IV placement for medication and fluids. Head CT today was negative. Patient instructed to f/u with PCP for her scheduled appointment next Tuesday.   Final Clinical Impressions(s) / ED Diagnoses   Final diagnoses:  Chronic intractable headache, unspecified headache type    New Prescriptions Current Discharge Medication List       Reymundo Poll, MD 07/05/16 1457    Gwyneth Sprout, MD 07/14/16 2154

## 2016-08-07 ENCOUNTER — Ambulatory Visit (HOSPITAL_COMMUNITY)
Admission: RE | Admit: 2016-08-07 | Discharge: 2016-08-07 | Disposition: A | Discharge status: Home / Self Care | Payer: 59 | Source: Ambulatory Visit | Attending: Family Medicine | Admitting: Family Medicine

## 2016-08-07 DIAGNOSIS — E785 Hyperlipidemia, unspecified: Secondary | ICD-10-CM | POA: Insufficient documentation

## 2016-08-07 DIAGNOSIS — R0600 Dyspnea, unspecified: Secondary | ICD-10-CM | POA: Insufficient documentation

## 2016-08-07 DIAGNOSIS — K219 Gastro-esophageal reflux disease without esophagitis: Secondary | ICD-10-CM | POA: Insufficient documentation

## 2016-08-07 DIAGNOSIS — K589 Irritable bowel syndrome without diarrhea: Secondary | ICD-10-CM | POA: Diagnosis not present

## 2016-08-07 DIAGNOSIS — I10 Essential (primary) hypertension: Secondary | ICD-10-CM | POA: Insufficient documentation

## 2016-08-07 DIAGNOSIS — Z8673 Personal history of transient ischemic attack (TIA), and cerebral infarction without residual deficits: Secondary | ICD-10-CM | POA: Diagnosis not present

## 2016-08-07 NOTE — Progress Notes (Signed)
  Echocardiogram 2D Echocardiogram has been performed.  Arvil ChacoFoster, Krizia Flight 08/07/2016, 11:15 AM

## 2016-08-21 ENCOUNTER — Encounter: Payer: Self-pay | Admitting: Neurology

## 2016-08-21 ENCOUNTER — Ambulatory Visit (INDEPENDENT_AMBULATORY_CARE_PROVIDER_SITE_OTHER): Payer: 59 | Admitting: Neurology

## 2016-08-21 VITALS — BP 152/103 | HR 80 | Ht 62.5 in | Wt 157.2 lb

## 2016-08-21 DIAGNOSIS — G4489 Other headache syndrome: Secondary | ICD-10-CM | POA: Diagnosis not present

## 2016-08-21 DIAGNOSIS — R52 Pain, unspecified: Secondary | ICD-10-CM

## 2016-08-21 MED ORDER — NORTRIPTYLINE HCL 25 MG PO CAPS: 25.0000 mg | ORAL_CAPSULE | Freq: Every day | ORAL | 11 refills | Status: DC

## 2016-08-21 MED ORDER — SUMATRIPTAN SUCCINATE 25 MG PO TABS: 25.0000 mg | ORAL_TABLET | ORAL | 6 refills | Status: DC | PRN

## 2016-08-21 NOTE — Progress Notes (Signed)
PATIENT: Clemetine MarkerElizabeth A Shanafelt DOB: 08/17/1961  Chief Complaint  Patient presents with  . Migraine    Reports having daily headaches for the last month.  Says some of these headaches have been associated with weakness, muscle aches, light-headedness/dizziness, blurred vision and nausea.  She has been taking OTC NSAIDS without relief.  Says she was placed on lisinopril for migraines.  She has failed other blood pressure medication in the past but is unable to recall the names.  She completed a six day round of Prednisone in August but it did not improve her symptoms.     HISTORICAL  Clemetine Markerlizabeth A Gossard is a 55 years old right-handed female, alone at today's clinical visit, seen in refer by her primary care physician Dr. Merri Brunetteandace Smith for evaluation of frequent headaches weakness, dizziness on August 21 2016  I reviewed and summarized the referring note, she has history of hypertension, hyperlipidemia, GERD, TIA, in 2011 was reported reported normal MRI of the brain, echocardiogram, carotid Doppler study, CardioNet monitoring showed no evidence of atrial fibrillation,   Since August third 2017, she presented with low-grade fever, shortness of breath, chills, whole body achy pain, generalized weakness, chill went away in 1 week, but she continued to feel exhausted, lack of stamina, lightheaded when standing up, noted to have elevated blood pressure 200/100 few times EMS has to be called, she has to take off from work,  I personally reviewed CAT scan of the brain on July 05 2016 was normal,   echocardiogram on August 07 2016 was normal.  Lab normal cbc, hg 11.8, normal BMP K 3.2  Over the past few weeks, her symptoms overall has improved, but she continue complains constant pressure headaches, blurry vision, difficulty opening her eyes, difficulty sleeping at nighttime, there was a light noise sensitivity during intense headache, she does have a history of migraine.  REVIEW OF  SYSTEMS: Full 14 system review of systems performed and notable only for fatigue, eye pain, shortness of breath, diarrhea, cramps, headaches, weakness, dizziness decreased energy  ALLERGIES: Allergies  Allergen Reactions  . Other Nausea And Vomiting and Other (See Comments)    Dairy products - eggs, cheese, milk   . Sulfa Antibiotics Other (See Comments)    THRUSH    HOME MEDICATIONS: Current Outpatient Prescriptions  Medication Sig Dispense Refill  . aspirin 325 MG tablet Take 325 mg by mouth at bedtime.     . fexofenadine (ALLEGRA) 180 MG tablet Take 180 mg by mouth daily as needed for allergies.     Marland Kitchen. ibuprofen (ADVIL,MOTRIN) 200 MG tablet Take 200-400 mg by mouth every 6 (six) hours as needed (For pain.).    Marland Kitchen. lansoprazole (PREVACID) 30 MG capsule Take 30 mg by mouth daily as needed (For heartburn or acid reflux.).     Marland Kitchen. lisinopril (PRINIVIL,ZESTRIL) 10 MG tablet Take 10 mg by mouth daily.    Marland Kitchen. POTASSIUM CHLORIDE PO Take 10 mEq by mouth 2 (two) times daily.    Marland Kitchen. triamterene-hydrochlorothiazide (MAXZIDE) 75-50 MG per tablet Take 1 tablet by mouth daily.    . Vitamin D, Ergocalciferol, (DRISDOL) 50000 units CAPS capsule Take 50,000 Units by mouth 2 (two) times a week.     No current facility-administered medications for this visit.     PAST MEDICAL HISTORY: Past Medical History:  Diagnosis Date  . GERD (gastroesophageal reflux disease)   . Hyperlipidemia   . Hypertension   . IBS (irritable bowel syndrome)   . Migraines   .  TIA (transient ischemic attack)    MRI 02/03/10 normal. Echo 3/7/11normal EF,normal. Carotid doppler 02/05/10 normal. Cardionet moniitor-no AFIB    PAST SURGICAL HISTORY: Past Surgical History:  Procedure Laterality Date  . ABDOMINAL HYSTERECTOMY    . bladder tacking    . BUNIONECTOMY Bilateral   . COLONOSCOPY  09/2011   hyperplastic polyps,diverticula,repeat 10 years-Dr Magod  . LAPAROSCOPIC APPENDECTOMY N/A 05/25/2015   Procedure: APPENDECTOMY  LAPAROSCOPIC;  Surgeon: Avel Peace, MD;  Location: [facility];  Service: General;  Laterality: N/A;    FAMILY HISTORY: Family History  Problem Relation Age of Onset  . Heart disease Mother   . Diabetes Father   . Throat cancer Father   . Stroke Maternal Grandfather   . Cancer Maternal Grandfather     unsure of type  . Cancer Paternal Grandfather     unsure of type  . Diabetes Sister   . Pancreatic cancer Sister     SOCIAL HISTORY:  Social History   Social History  . Marital status: Married    Spouse name: N/A  . Number of children: 2  . Years of education: 12   Occupational History  . 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 Smoking status: Never Smoker  . Smokeless tobacco: Never Used  . Alcohol use No  . Drug use: No  . Sexual activity: Not on file   Other Topics Concern  . Not on file   Social History Narrative   Lives at home with husband.   Right-handed.   1-2 cups caffeine per day.     PHYSICAL EXAM   Vitals:   08/21/16 0736  BP: (!) 152/103  Pulse: 80  Weight: 157 lb 4 oz (71.3 kg)  Height: 5' 2.5" (1.588 m)    Not recorded      Body mass index is 28.3 kg/m.  PHYSICAL EXAMNIATION:  Gen: NAD, conversant, well nourised, obese, well groomed                     Cardiovascular: Regular rate rhythm, no peripheral edema, warm, nontender. Eyes: Conjunctivae clear without exudates or hemorrhage Neck: Supple, no carotid bruise. Pulmonary: Clear to auscultation bilaterally   NEUROLOGICAL EXAM:  MENTAL STATUS: Speech:    Speech is normal; fluent and spontaneous with normal comprehension.  Cognition:     Orientation to time, place and person     Normal recent and remote memory     Normal Attention span and concentration     Normal Language, naming, repeating,spontaneous speech     Fund of knowledge   CRANIAL NERVES: CN II: Visual fields are full to confrontation. Fundoscopic exam is normal with  sharp discs and no vascular changes. Pupils are round equal and briskly reactive to light. CN III, IV, VI: extraocular movement are normal. No ptosis. CN V: Facial sensation is intact to pinprick in all 3 divisions bilaterally. Corneal responses are intact.  CN VII: Face is symmetric with normal eye closure and smile. CN VIII: Hearing is normal to rubbing fingers CN IX, X: Palate elevates symmetrically. Phonation is normal. CN XI: Head turning and shoulder shrug are intact CN XII: Tongue is midline with normal movements and no atrophy.  MOTOR: There is no pronator drift of out-stretched arms. Muscle bulk and tone are normal. Muscle strength is normal.  REFLEXES: Reflexes are 2+ and symmetric at the biceps, triceps, knees, and ankles. Plantar responses are flexor.  SENSORY:  Intact to light touch, pinprick, positional sensation and vibratory sensation are intact in fingers and toes.  COORDINATION: Rapid alternating movements and fine finger movements are intact. There is no dysmetria on finger-to-nose and heel-knee-shin.    GAIT/STANCE: Posture is normal. Gait is steady with normal steps, base, arm swing, and turning. Heel and toe walking are normal. Tandem gait is normal.  Romberg is absent.   DIAGNOSTIC DATA (LABS, IMAGING, TESTING) - I reviewed patient records, labs, notes, testing and imaging myself where available.   ASSESSMENT AND PLAN  SAHARAH SHERROW is a 55 y.o. female   Worsening headaches with migraine features Diffuse body achy pain  Laboratory evaluation to rule out inflammatory process  Nortriptyline 25 mg titrating to 50 mg every night as migraine prevention  Imitrex 25 mg as needed   Levert Feinstein, M.D. Ph.D.  [facility] [address], [address] [place], [place] [address] Ph: [phone] Fax: [phone]  CC: Merri Brunette, MD

## 2016-08-22 ENCOUNTER — Telehealth: Payer: Self-pay | Admitting: Neurology

## 2016-08-22 LAB — COMPREHENSIVE METABOLIC PANEL
A/G RATIO: 1.3 (ref 1.2–2.2)
ALK PHOS: 54 IU/L (ref 39–117)
ALT: 9 IU/L (ref 0–32)
AST: 12 IU/L (ref 0–40)
Albumin: 4 g/dL (ref 3.5–5.5)
BILIRUBIN TOTAL: 0.4 mg/dL (ref 0.0–1.2)
BUN/Creatinine Ratio: 18 (ref 9–23)
BUN: 17 mg/dL (ref 6–24)
CALCIUM: 9.7 mg/dL (ref 8.7–10.2)
CHLORIDE: 102 mmol/L (ref 96–106)
CO2: 28 mmol/L (ref 18–29)
Creatinine, Ser: 0.92 mg/dL (ref 0.57–1.00)
GFR calc Af Amer: 81 mL/min/{1.73_m2} (ref >59)
GFR, EST NON AFRICAN AMERICAN: 70 mL/min/{1.73_m2} (ref >59)
GLOBULIN, TOTAL: 3 g/dL (ref 1.5–4.5)
Glucose: 104 mg/dL — ABNORMAL HIGH (ref 65–99)
POTASSIUM: 4 mmol/L (ref 3.5–5.2)
SODIUM: 145 mmol/L — AB (ref 134–144)
Total Protein: 7 g/dL (ref 6.0–8.5)

## 2016-08-22 LAB — CBC
HEMATOCRIT: 38.2 % (ref 34.0–46.6)
Hemoglobin: 11.9 g/dL (ref 11.1–15.9)
MCH: 26.7 pg (ref 26.6–33.0)
MCHC: 31.2 g/dL — AB (ref 31.5–35.7)
MCV: 86 fL (ref 79–97)
PLATELETS: 302 10*3/uL (ref 150–379)
RBC: 4.46 x10E6/uL (ref 3.77–5.28)
RDW: 15.5 % — AB (ref 12.3–15.4)
WBC: 5.7 10*3/uL (ref 3.4–10.8)

## 2016-08-22 LAB — FOLATE: FOLATE: 11.8 ng/mL (ref >3.0)

## 2016-08-22 LAB — ANA W/REFLEX IF POSITIVE: ANA: NEGATIVE

## 2016-08-22 LAB — CK: Total CK: 62 U/L (ref 24–173)

## 2016-08-22 LAB — VITAMIN B12: Vitamin B-12: 453 pg/mL (ref 211–946)

## 2016-08-22 LAB — THYROID PANEL WITH TSH
Free Thyroxine Index: 1.9 (ref 1.2–4.9)
T3 Uptake Ratio: 26 % (ref 24–39)
T4, Total: 7.4 ug/dL (ref 4.5–12.0)
TSH: 1.18 u[IU]/mL (ref 0.450–4.500)

## 2016-08-22 LAB — C-REACTIVE PROTEIN: CRP: 2.3 mg/L (ref 0.0–4.9)

## 2016-08-22 LAB — SEDIMENTATION RATE: Sed Rate: 9 mm/hr (ref 0–40)

## 2016-08-22 NOTE — Telephone Encounter (Signed)
[facility]-Dr. Merri Brunetteandace Smith [phone] requests office visit notes and any test results from yesterday's visit.

## 2016-10-02 ENCOUNTER — Ambulatory Visit (INDEPENDENT_AMBULATORY_CARE_PROVIDER_SITE_OTHER): Payer: 59 | Admitting: Neurology

## 2016-10-02 ENCOUNTER — Encounter: Payer: Self-pay | Admitting: Neurology

## 2016-10-02 VITALS — BP 155/97 | HR 75 | Ht 62.5 in | Wt 158.2 lb

## 2016-10-02 DIAGNOSIS — G43709 Chronic migraine without aura, not intractable, without status migrainosus: Secondary | ICD-10-CM | POA: Diagnosis not present

## 2016-10-02 DIAGNOSIS — R5383 Other fatigue: Secondary | ICD-10-CM

## 2016-10-02 DIAGNOSIS — IMO0002 Reserved for concepts with insufficient information to code with codable children: Secondary | ICD-10-CM

## 2016-10-02 MED ORDER — NORTRIPTYLINE HCL 25 MG PO CAPS: 50.0000 mg | ORAL_CAPSULE | Freq: Every day | ORAL | 11 refills | Status: DC

## 2016-10-02 MED ORDER — SUMATRIPTAN SUCCINATE 25 MG PO TABS: 25.0000 mg | ORAL_TABLET | ORAL | 11 refills | Status: DC | PRN

## 2016-10-02 NOTE — Progress Notes (Signed)
PATIENT: Mackenzie Carr DOB: March 12, 1961  Chief Complaint  Patient presents with  . Migraine    She was confused about her medications.  She thought nortriptyline was only taken when needed so she never started the medication.  She is still having daily headaches.  Sumatriptan has worked well for her more severe pain.     HISTORICAL  Mackenzie Carr is a 56 years old right-handed female, alone at today's clinical visit, seen in refer by her primary care physician Dr. Carol Ada for evaluation of frequent headaches weakness, dizziness on August 21 2016  I reviewed and summarized the referring note, she has history of hypertension, hyperlipidemia, GERD, TIA, in 2011 was reported reported normal MRI of the brain, echocardiogram, carotid Doppler study, CardioNet monitoring showed no evidence of atrial fibrillation,   Since August third 2017, she presented with low-grade fever, shortness of breath, chills, whole body achy pain, generalized weakness, chill went away in 1 week, but she continued to feel exhausted, lack of stamina, lightheaded when standing up, noted to have elevated blood pressure 200/100 few times EMS has to be called, she has to take off from work,  I personally reviewed CAT scan of the brain on July 05 2016 was normal,   echocardiogram on August 07 2016 was normal.  Lab normal cbc, hg 11.8, normal BMP K 3.2  Over the past few weeks, her symptoms overall has improved, but she continue complains constant pressure headaches, blurry vision, difficulty opening her eyes, difficulty sleeping at nighttime, there was a light noise sensitivity during intense headache, she does have a history of migraine.  Update October 02 2016: Her headaches Is much better, she reported 80% improvement, she takes imitrex as needed, which has been very helpful,   I reviewed laboratory evaluation in September 2017, negative ANA, ESR, CPK, thyroid panel, C-reactive protein,  folic acid, vitamin F79, normal CMP, CBC,  If she gets anxious, rushing or doing something, she felt unbalanced, out of breath with fast talking, if she picked up a heavy box,   REVIEW OF SYSTEMS: Full 14 system review of systems performed and notable only for fatigue, light sensitivity, headaches, food allergy  ALLERGIES: Allergies  Allergen Reactions  . Other Nausea And Vomiting and Other (See Comments)    Dairy products - eggs, cheese, milk   . Sulfa Antibiotics Other (See Comments)    THRUSH    HOME MEDICATIONS: Current Outpatient Prescriptions  Medication Sig Dispense Refill  . aspirin 325 MG tablet Take 325 mg by mouth at bedtime.     . fexofenadine (ALLEGRA) 180 MG tablet Take 180 mg by mouth daily as needed for allergies.     Marland Kitchen ibuprofen (ADVIL,MOTRIN) 200 MG tablet Take 200-400 mg by mouth every 6 (six) hours as needed (For pain.).    Marland Kitchen lansoprazole (PREVACID) 30 MG capsule Take 30 mg by mouth daily as needed (For heartburn or acid reflux.).     Marland Kitchen lisinopril (PRINIVIL,ZESTRIL) 10 MG tablet Take 10 mg by mouth daily.    Marland Kitchen POTASSIUM CHLORIDE PO Take 10 mEq by mouth 2 (two) times daily.    Marland Kitchen triamterene-hydrochlorothiazide (MAXZIDE) 75-50 MG per tablet Take 1 tablet by mouth daily.    . Vitamin D, Ergocalciferol, (DRISDOL) 50000 units CAPS capsule Take 50,000 Units by mouth 2 (two) times a week.     No current facility-administered medications for this visit.     PAST MEDICAL HISTORY: Past Medical History:  Diagnosis Date  . GERD (  gastroesophageal reflux disease)   . Hyperlipidemia   . Hypertension   . IBS (irritable bowel syndrome)   . Migraines   . TIA (transient ischemic attack)    MRI 02/03/10 normal. Echo 3/7/11normal EF,normal. Carotid doppler 02/05/10 normal. Cardionet moniitor-no AFIB    PAST SURGICAL HISTORY: Past Surgical History:  Procedure Laterality Date  . ABDOMINAL HYSTERECTOMY    . bladder tacking    . BUNIONECTOMY Bilateral   . COLONOSCOPY  09/2011     hyperplastic polyps,diverticula,repeat 10 years-Dr Magod  . LAPAROSCOPIC APPENDECTOMY N/A 05/25/2015   Procedure: APPENDECTOMY LAPAROSCOPIC;  Surgeon: Jackolyn Confer, MD;  Location: [facility];  Service: General;  Laterality: N/A;    FAMILY HISTORY: Family History  Problem Relation Age of Onset  . Heart disease Mother   . Diabetes Father   . Throat cancer Father   . Stroke Maternal Grandfather   . Cancer Maternal Grandfather     unsure of type  . Cancer Paternal Grandfather     unsure of type  . Diabetes Sister   . Pancreatic cancer Sister     SOCIAL HISTORY:  Social History   Social History  . Marital status: Married    Spouse name: N/A  . Number of children: 2  . Years of education: 12   Occupational History  . 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 Smoking status: Never Smoker  . Smokeless tobacco: Never Used  . Alcohol use No  . Drug use: No  . Sexual activity: Not on file   Other Topics Concern  . Not on file   Social History Narrative   Lives at home with husband.   Right-handed.   1-2 cups caffeine per day.     PHYSICAL EXAM   Vitals:   10/02/16 1452  BP: (!) 155/97  Pulse: 75  Weight: 158 lb 4 oz (71.8 kg)  Height: 5' 2.5" (1.588 m)    Not recorded      Body mass index is 28.48 kg/m.  PHYSICAL EXAMNIATION:  Gen: NAD, conversant, well nourised, obese, well groomed                     Cardiovascular: Regular rate rhythm, no peripheral edema, warm, nontender. Eyes: Conjunctivae clear without exudates or hemorrhage Neck: Supple, no carotid bruise. Pulmonary: Clear to auscultation bilaterally   NEUROLOGICAL EXAM:  MENTAL STATUS: Speech:    Speech is normal; fluent and spontaneous with normal comprehension.  Cognition:     Orientation to time, place and person     Normal recent and remote memory     Normal Attention span and concentration     Normal Language, naming, repeating,spontaneous  speech     Fund of knowledge   CRANIAL NERVES: CN II: Visual fields are full to confrontation. Fundoscopic exam is normal with sharp discs and no vascular changes. Pupils are round equal and briskly reactive to light. CN III, IV, VI: extraocular movement are normal. No ptosis. CN V: Facial sensation is intact to pinprick in all 3 divisions bilaterally. Corneal responses are intact.  CN VII: Face is symmetric with normal eye closure and smile. CN VIII: Hearing is normal to rubbing fingers CN IX, X: Palate elevates symmetrically. Phonation is normal. CN XI: Head turning and shoulder shrug are intact CN XII: Tongue is midline with normal movements and no atrophy.  MOTOR: There is no pronator drift of out-stretched arms. Muscle bulk and tone  are normal. Muscle strength is normal.  REFLEXES: Reflexes are 2+ and symmetric at the biceps, triceps, knees, and ankles. Plantar responses are flexor.  SENSORY: Intact to light touch, pinprick, positional sensation and vibratory sensation are intact in fingers and toes.  COORDINATION: Rapid alternating movements and fine finger movements are intact. There is no dysmetria on finger-to-nose and heel-knee-shin.    GAIT/STANCE: Posture is normal. Gait is steady with normal steps, base, arm swing, and turning. Heel and toe walking are normal. Tandem gait is normal.  Romberg is absent.   DIAGNOSTIC DATA (LABS, IMAGING, TESTING) - I reviewed patient records, labs, notes, testing and imaging myself where available.   ASSESSMENT AND PLAN  Mackenzie Carr is a 55 y.o. female   Chronic migraine Diffuse body achy pain, fatigue  Laboratory evaluation showed no triple etiology  Nortriptyline 25 mg, titrating to 50 mg every night as migraine prevention  Imitrex 25 mg as needed  return to clinic in 6 months with nurse practitioner  If there is no new neurological complains, may discharge her to primary care   Marcial Pacas, M.D. Ph.D.  [facility] [address] [place],  [address] Ph: [phone] Fax: [phone]  CC: Carol Ada, MD

## 2017-01-15 DIAGNOSIS — K219 Gastro-esophageal reflux disease without esophagitis: Secondary | ICD-10-CM | POA: Diagnosis not present

## 2017-01-15 DIAGNOSIS — I10 Essential (primary) hypertension: Secondary | ICD-10-CM | POA: Diagnosis not present

## 2017-03-17 DIAGNOSIS — R87612 Low grade squamous intraepithelial lesion on cytologic smear of cervix (LGSIL): Secondary | ICD-10-CM | POA: Diagnosis not present

## 2017-04-03 ENCOUNTER — Ambulatory Visit: Payer: 59 | Admitting: Nurse Practitioner

## 2017-04-05 ENCOUNTER — Emergency Department (HOSPITAL_COMMUNITY): Payer: 59

## 2017-04-05 ENCOUNTER — Encounter (HOSPITAL_COMMUNITY): Payer: Self-pay | Admitting: Emergency Medicine

## 2017-04-05 ENCOUNTER — Emergency Department (HOSPITAL_COMMUNITY)
Admission: EM | Admit: 2017-04-05 | Discharge: 2017-04-05 | Disposition: A | Discharge status: Home / Self Care | Payer: 59 | Attending: Emergency Medicine | Admitting: Emergency Medicine

## 2017-04-05 DIAGNOSIS — G43909 Migraine, unspecified, not intractable, without status migrainosus: Secondary | ICD-10-CM | POA: Diagnosis present

## 2017-04-05 DIAGNOSIS — Z8673 Personal history of transient ischemic attack (TIA), and cerebral infarction without residual deficits: Secondary | ICD-10-CM | POA: Insufficient documentation

## 2017-04-05 DIAGNOSIS — Z7982 Long term (current) use of aspirin: Secondary | ICD-10-CM | POA: Diagnosis not present

## 2017-04-05 DIAGNOSIS — Z79899 Other long term (current) drug therapy: Secondary | ICD-10-CM | POA: Diagnosis not present

## 2017-04-05 DIAGNOSIS — I10 Essential (primary) hypertension: Secondary | ICD-10-CM | POA: Insufficient documentation

## 2017-04-05 DIAGNOSIS — R030 Elevated blood-pressure reading, without diagnosis of hypertension: Secondary | ICD-10-CM | POA: Diagnosis not present

## 2017-04-05 DIAGNOSIS — G43809 Other migraine, not intractable, without status migrainosus: Secondary | ICD-10-CM | POA: Diagnosis not present

## 2017-04-05 DIAGNOSIS — R51 Headache: Secondary | ICD-10-CM | POA: Diagnosis not present

## 2017-04-05 LAB — CBC WITH DIFFERENTIAL/PLATELET
BASOS ABS: 0 10*3/uL (ref 0.0–0.1)
BASOS PCT: 0 %
Eosinophils Absolute: 0.2 10*3/uL (ref 0.0–0.7)
Eosinophils Relative: 2 %
HEMATOCRIT: 38.8 % (ref 36.0–46.0)
Hemoglobin: 12.7 g/dL (ref 12.0–15.0)
Lymphocytes Relative: 35 %
Lymphs Abs: 2.4 10*3/uL (ref 0.7–4.0)
MCH: 26.8 pg (ref 26.0–34.0)
MCHC: 32.7 g/dL (ref 30.0–36.0)
MCV: 82 fL (ref 78.0–100.0)
MONO ABS: 0.4 10*3/uL (ref 0.1–1.0)
Monocytes Relative: 6 %
NEUTROS ABS: 3.8 10*3/uL (ref 1.7–7.7)
Neutrophils Relative %: 57 %
PLATELETS: 228 10*3/uL (ref 150–400)
RBC: 4.73 MIL/uL (ref 3.87–5.11)
RDW: 14.4 % (ref 11.5–15.5)
WBC: 6.7 10*3/uL (ref 4.0–10.5)

## 2017-04-05 LAB — BASIC METABOLIC PANEL
ANION GAP: 10 (ref 5–15)
BUN: 10 mg/dL (ref 6–20)
CALCIUM: 9.4 mg/dL (ref 8.9–10.3)
CO2: 26 mmol/L (ref 22–32)
Chloride: 106 mmol/L (ref 101–111)
Creatinine, Ser: 0.88 mg/dL (ref 0.44–1.00)
GFR calc Af Amer: >60 mL/min (ref >60)
GLUCOSE: 99 mg/dL (ref 65–99)
Potassium: 4.1 mmol/L (ref 3.5–5.1)
Sodium: 142 mmol/L (ref 135–145)

## 2017-04-05 MED ORDER — METOCLOPRAMIDE HCL 5 MG/ML IJ SOLN
10.0000 mg | Freq: Once | INTRAMUSCULAR | Status: AC
  Administered 2017-04-05: 10 mg via INTRAVENOUS
  Filled 2017-04-05: qty 2

## 2017-04-05 MED ORDER — DIPHENHYDRAMINE HCL 50 MG/ML IJ SOLN
25.0000 mg | Freq: Once | INTRAMUSCULAR | Status: AC
  Administered 2017-04-05: 25 mg via INTRAVENOUS
  Filled 2017-04-05: qty 1

## 2017-04-05 MED ORDER — ENALAPRILAT 1.25 MG/ML IV SOLN
1.2500 mg | Freq: Once | INTRAVENOUS | Status: AC
  Administered 2017-04-05: 1.25 mg via INTRAVENOUS
  Filled 2017-04-05: qty 1

## 2017-04-05 MED ORDER — KETOROLAC TROMETHAMINE 30 MG/ML IJ SOLN
30.0000 mg | Freq: Once | INTRAMUSCULAR | Status: AC
  Administered 2017-04-05: 30 mg via INTRAVENOUS
  Filled 2017-04-05: qty 1

## 2017-04-05 NOTE — ED Triage Notes (Addendum)
Per EMS, patient from UC, c/o hypertension today with dizziness and headache. Denies blurred vision, chest pain, and SOB. Ambulatory with EMS. Reports she took her lisinopril today. 180/110 with EMS.

## 2017-04-05 NOTE — ED Notes (Signed)
Patient transported to CT 

## 2017-04-05 NOTE — ED Provider Notes (Addendum)
[facility]-EMERGENCY DEPT Provider Note   CSN: [id number] Arrival date & time: 04/05/17  1600     History   Chief Complaint Chief Complaint  Patient presents with  . Hypertension    HPI Mackenzie Carr is a 56 y.o. female. Complaint is high blood pressure, headache, and right arm symptoms.  HPI: This is a [age] female who presents to the ambulatory urgent care. She has a history of TIA and migraine headaches as well as hypertension. Normally her blood pressure is well-controlled on 20 mg of lisinopril.  She was in her normal state of health today. She had been inside. She walked outside to accept the delivery. She came back inside and drink glass of water. She states almost immediately after this she had a feeling of lightheadedness. Was not to the point of near syncope. Within the next half an hour she developed headache. Left-sided greater than right. Throbbing and only a 4 out of 10. No nausea. No diaphoresis. No chest pain. No palpitations.  She states that her right arm started tingling initially. Then felt weak for little while and feels almost normally now.  Her blood pressure was high with paramedics. She sat for a while and he was rechecked. However as she got up and around it was again over 200. Her family elected to take her to urgent care. They deferred evaluation there and recommended that she come here. 911 was again summoned and she was brought here by ambulance.  In 2011 she presented to [facility] similar symptoms. Underwent extensive evaluation including neurological evaluation. She had a normal exam. She had a CT scan did not show hemorrhage or acute stroke. MRI and MRA show no acute abnormality is for she was discharged on her same blood pressure medication, daily aspirin, and Lipitor for cholesterol.  Past Medical History:  Diagnosis Date  . GERD (gastroesophageal reflux disease)   . Hyperlipidemia   . Hypertension   . IBS (irritable bowel syndrome)   .  Migraines   . TIA (transient ischemic attack)    MRI 02/03/10 normal. Echo 3/7/11normal EF,normal. Carotid doppler 02/05/10 normal. Cardionet moniitor-no AFIB    Patient Active Problem List   Diagnosis Date Noted  . Chronic migraine 10/02/2016  . Fatigue 10/02/2016  . Acute appendicitis 05/25/2015  . Appendicitis, acute s/p lap appendectomy 05/26/15 05/25/2015  . Essential hypertension, benign 11/26/2013  . HTN (hypertension) 11/26/2013  . GERD (gastroesophageal reflux disease) 11/26/2013  . History of TIA (transient ischemic attack) 11/26/2013    Past Surgical History:  Procedure Laterality Date  . ABDOMINAL HYSTERECTOMY    . bladder tacking    . BUNIONECTOMY Bilateral   . COLONOSCOPY  09/2011   hyperplastic polyps,diverticula,repeat 10 years-Dr Magod  . LAPAROSCOPIC APPENDECTOMY N/A 05/25/2015   Procedure: APPENDECTOMY LAPAROSCOPIC;  Surgeon: Avel Peaceodd Rosenbower, MD;  Location: [facility] ORS;  Service: General;  Laterality: N/A;    OB History    No data available       Home Medications    Prior to Admission medications   Medication Sig Start Date End Date Taking? Authorizing Provider  aspirin 325 MG tablet Take 325 mg by mouth at bedtime.     [provider]  fexofenadine (ALLEGRA) 180 MG tablet Take 180 mg by mouth daily as needed for allergies.     [provider]  HYDROcodone-acetaminophen (NORCO/VICODIN) 5-325 MG per tablet Take 1-2 tablets by mouth every 4 (four) hours as needed for moderate pain. 05/26/15   Barnetta Chapelsborne, Kelly, PA-C  ibuprofen (ADVIL,MOTRIN) 200 MG tablet Take 200-400 mg by mouth every 6 (six) hours as needed (For pain.).    [provider]  lansoprazole (PREVACID) 30 MG capsule Take 30 mg by mouth daily as needed (For heartburn or acid reflux.).  11/13/13   [provider]  lisinopril (PRINIVIL,ZESTRIL) 10 MG tablet Take 10 mg by mouth daily.    [provider]  nortriptyline (PAMELOR) 25 MG capsule Take 2 capsules (50 mg  total) by mouth at bedtime. 10/02/16   Levert Feinstein, MD  SUMAtriptan (IMITREX) 25 MG tablet Take 1 tablet (25 mg total) by mouth every 2 (two) hours as needed for migraine. May repeat in 2 hours if headache persists or recurs. 10/02/16   Levert Feinstein, MD  triamterene-hydrochlorothiazide (MAXZIDE-25) 37.5-25 MG tablet Take 1 tablet by mouth daily.    [provider]    Family History Family History  Problem Relation Age of Onset  . Heart disease Mother   . Diabetes Father   . Throat cancer Father   . Stroke Maternal Grandfather   . Cancer Maternal Grandfather     unsure of type  . Cancer Paternal Grandfather     unsure of type  . Diabetes Sister   . Pancreatic cancer Sister     Social History Social History  Substance Use Topics  . Smoking status: Never Smoker  . Smokeless tobacco: Never Used  . Alcohol use No     Allergies   Other and Sulfa antibiotics   Review of Systems Review of Systems  Constitutional: Negative for appetite change, chills, diaphoresis, fatigue and fever.  HENT: Negative for mouth sores, sore throat and trouble swallowing.   Eyes: Negative for visual disturbance.  Respiratory: Negative for cough, chest tightness, shortness of breath and wheezing.   Cardiovascular: Negative for chest pain.  Gastrointestinal: Negative for abdominal distention, abdominal pain, diarrhea, nausea and vomiting.  Endocrine: Negative for polydipsia, polyphagia and polyuria.  Genitourinary: Negative for dysuria, frequency and hematuria.  Musculoskeletal: Negative for gait problem.  Skin: Negative for color change, pallor and rash.  Neurological: Positive for numbness and headaches. Negative for dizziness, syncope and light-headedness.  Hematological: Does not bruise/bleed easily.  Psychiatric/Behavioral: Negative for behavioral problems and confusion.     Physical Exam Updated Vital Signs BP (!) 150/91   Pulse 71   Temp 98.6 F (37 C)   Resp 12   SpO2 97%    Physical Exam  Constitutional: She is oriented to person, place, and time. She appears well-developed and well-nourished. No distress.  HENT:  Head: Normocephalic.  Eyes: Conjunctivae are normal. Pupils are equal, round, and reactive to light. No scleral icterus.  Neck: Normal range of motion. Neck supple. No thyromegaly present.  Cardiovascular: Normal rate and regular rhythm.  Exam reveals no gallop and no friction rub.   No murmur heard. Pulmonary/Chest: Effort normal and breath sounds normal. No respiratory distress. She has no wheezes. She has no rales.  Abdominal: Soft. Bowel sounds are normal. She exhibits no distension. There is no tenderness. There is no rebound.  Musculoskeletal: Normal range of motion.  Neurological: She is alert and oriented to person, place, and time.  Normal cranial nerve exam. No facial droop. Normal vision and visual fields. No carotid bruits. No pronator or leg drift. Normal sensation of the 4 extremities. Normal finger to nose.  Skin: Skin is warm and dry. No rash noted.  Psychiatric: She has a normal mood and affect. Her behavior is normal.  ED Treatments / Results  Labs (all labs ordered are listed, but only abnormal results are displayed) Labs Reviewed  CBC WITH DIFFERENTIAL/PLATELET  BASIC METABOLIC PANEL  CBC WITH DIFFERENTIAL/PLATELET    EKG  EKG Interpretation  Date/Time:  Saturday Apr 05 2017 17:03:46 EDT Ventricular Rate:  70 PR Interval:    QRS Duration: 101 QT Interval:  452 QTC Calculation: 488 R Axis:   44 Text Interpretation:  Sinus rhythm Low voltage, precordial leads Borderline prolonged QT interval Confirmed by Fayrene Fearing  MD, Gabryella Murfin ([id number]) on 04/05/2017 5:07:46 PM       Radiology Ct Head Wo Contrast  Result Date: 04/05/2017 CLINICAL DATA:  Acute onset of dizziness and headache. Initial encounter. EXAM: CT HEAD WITHOUT CONTRAST TECHNIQUE: Contiguous axial images were obtained from the base of the skull through the vertex  without intravenous contrast. COMPARISON:  CT of the head performed 07/05/2016 FINDINGS: Brain: No evidence of acute infarction, hemorrhage, hydrocephalus, extra-axial collection or mass lesion/mass effect. The posterior fossa, including the cerebellum, brainstem and fourth ventricle, is within normal limits. The third and lateral ventricles, and basal ganglia are unremarkable in appearance. The cerebral hemispheres are symmetric in appearance, with normal gray-white differentiation. No mass effect or midline shift is seen. Vascular: No hyperdense vessel or unexpected calcification. Skull: There is no evidence of fracture; visualized osseous structures are unremarkable in appearance. Sinuses/Orbits: The visualized portions of the orbits are within normal limits. The paranasal sinuses and mastoid air cells are well-aerated. Other: No significant soft tissue abnormalities are seen. IMPRESSION: Unremarkable noncontrast CT of the head. Electronically Signed   By: Roanna Raider M.D.   On: 04/05/2017 17:09    Procedures Procedures (including critical care time)  Medications Ordered in ED Medications  metoCLOPramide (REGLAN) injection 10 mg (10 mg Intravenous Given 04/05/17 1708)  diphenhydrAMINE (BENADRYL) injection 25 mg (25 mg Intravenous Given 04/05/17 1708)  ketorolac (TORADOL) 30 MG/ML injection 30 mg (30 mg Intravenous Given 04/05/17 1813)  enalaprilat (VASOTEC) injection 1.25 mg (1.25 mg Intravenous Given 04/05/17 1813)     Initial Impression / Assessment and Plan / ED Course  I have reviewed the triage vital signs and the nursing notes.  Pertinent labs & imaging results that were available during my care of the patient were reviewed by me and considered in my medical decision making (see chart for details).    No thunderclap headache. Slow onset of headache after lightheaded sensation. Blood pressure here remains elevated at 199. Plan CT of head. Will treat for possible migraine. With Reglan and  Benadryl. With her kidney function EKG. Will treat blood pressure as needed after initial treatment and evaluation of CT scan.  Final Clinical Impressions(s) / ED Diagnoses   Final diagnoses:  Hypertension, unspecified type  Other migraine without status migrainosus, not intractable    Patient has normal imaging. Her blood pressure improves. Her headache has resolved. Her exam is normal she has no additional weakness numbness or tingling. She had the valve several years ago including normal MRI/MRA. I think she is appropriate for discharge home. She will check and record her blood pressures daily. She will report any additional high readings to her physician on Monday. Return to ER with additional or recurrent symptoms.  New Prescriptions New Prescriptions   No medications on file     Rolland Porter, MD 04/05/17 1919    Rolland Porter, MD 04/05/17 Jerene Bears

## 2017-04-05 NOTE — ED Notes (Signed)
ED Provider at bedside. 

## 2017-04-05 NOTE — ED Notes (Signed)
Bed: WA03 Expected date:  Expected time:  Means of arrival:  Comments: 56 yo HTN

## 2017-04-05 NOTE — Discharge Instructions (Signed)
Check and record her blood pressures daily.  See your physician or make contact with physician Monday or Tuesday to report any additional high readings. Continue current medications without change until started by her physician.

## 2017-04-05 NOTE — ED Notes (Signed)
RN will start an IV line and draw blood work 

## 2017-04-07 DIAGNOSIS — R42 Dizziness and giddiness: Secondary | ICD-10-CM | POA: Diagnosis not present

## 2017-04-07 DIAGNOSIS — G43909 Migraine, unspecified, not intractable, without status migrainosus: Secondary | ICD-10-CM | POA: Diagnosis not present

## 2017-04-07 DIAGNOSIS — I10 Essential (primary) hypertension: Secondary | ICD-10-CM | POA: Diagnosis not present

## 2017-04-15 DIAGNOSIS — N951 Menopausal and female climacteric states: Secondary | ICD-10-CM | POA: Diagnosis not present

## 2017-05-07 DIAGNOSIS — I10 Essential (primary) hypertension: Secondary | ICD-10-CM | POA: Diagnosis not present

## 2017-05-07 DIAGNOSIS — N816 Rectocele: Secondary | ICD-10-CM | POA: Diagnosis not present

## 2017-05-07 DIAGNOSIS — N8110 Cystocele, unspecified: Secondary | ICD-10-CM | POA: Diagnosis not present

## 2017-05-12 DIAGNOSIS — N816 Rectocele: Secondary | ICD-10-CM | POA: Diagnosis not present

## 2017-05-12 DIAGNOSIS — N8110 Cystocele, unspecified: Secondary | ICD-10-CM | POA: Diagnosis not present

## 2017-07-15 DIAGNOSIS — K469 Unspecified abdominal hernia without obstruction or gangrene: Secondary | ICD-10-CM | POA: Diagnosis not present

## 2017-07-15 DIAGNOSIS — N819 Female genital prolapse, unspecified: Secondary | ICD-10-CM | POA: Insufficient documentation

## 2017-07-15 DIAGNOSIS — N816 Rectocele: Secondary | ICD-10-CM | POA: Insufficient documentation

## 2017-07-15 DIAGNOSIS — N3281 Overactive bladder: Secondary | ICD-10-CM | POA: Diagnosis not present

## 2017-07-25 ENCOUNTER — Encounter (HOSPITAL_COMMUNITY): Payer: Self-pay | Admitting: Emergency Medicine

## 2017-07-25 ENCOUNTER — Emergency Department (HOSPITAL_COMMUNITY)
Admission: EM | Admit: 2017-07-25 | Discharge: 2017-07-25 | Disposition: A | Discharge status: Home / Self Care | Payer: 59 | Attending: Emergency Medicine | Admitting: Emergency Medicine

## 2017-07-25 DIAGNOSIS — Z7982 Long term (current) use of aspirin: Secondary | ICD-10-CM | POA: Diagnosis not present

## 2017-07-25 DIAGNOSIS — Z79899 Other long term (current) drug therapy: Secondary | ICD-10-CM | POA: Diagnosis not present

## 2017-07-25 DIAGNOSIS — R404 Transient alteration of awareness: Secondary | ICD-10-CM | POA: Diagnosis not present

## 2017-07-25 DIAGNOSIS — R42 Dizziness and giddiness: Secondary | ICD-10-CM | POA: Insufficient documentation

## 2017-07-25 DIAGNOSIS — R40241 Glasgow coma scale score 13-15, unspecified time: Secondary | ICD-10-CM | POA: Diagnosis not present

## 2017-07-25 DIAGNOSIS — Z8673 Personal history of transient ischemic attack (TIA), and cerebral infarction without residual deficits: Secondary | ICD-10-CM | POA: Diagnosis not present

## 2017-07-25 DIAGNOSIS — I10 Essential (primary) hypertension: Secondary | ICD-10-CM | POA: Diagnosis not present

## 2017-07-25 LAB — CBC
HCT: 41.1 % (ref 36.0–46.0)
Hemoglobin: 13 g/dL (ref 12.0–15.0)
MCH: 26.3 pg (ref 26.0–34.0)
MCHC: 31.6 g/dL (ref 30.0–36.0)
MCV: 83 fL (ref 78.0–100.0)
Platelets: 276 10*3/uL (ref 150–400)
RBC: 4.95 MIL/uL (ref 3.87–5.11)
RDW: 15.1 % (ref 11.5–15.5)
WBC: 6.2 10*3/uL (ref 4.0–10.5)

## 2017-07-25 LAB — BASIC METABOLIC PANEL
Anion gap: 9 (ref 5–15)
BUN: 10 mg/dL (ref 6–20)
CALCIUM: 9.6 mg/dL (ref 8.9–10.3)
CO2: 29 mmol/L (ref 22–32)
Chloride: 103 mmol/L (ref 101–111)
Creatinine, Ser: 0.81 mg/dL (ref 0.44–1.00)
Glucose, Bld: 95 mg/dL (ref 65–99)
Potassium: 3.7 mmol/L (ref 3.5–5.1)
SODIUM: 141 mmol/L (ref 135–145)

## 2017-07-25 MED ORDER — AMLODIPINE BESYLATE 5 MG PO TABS
5.0000 mg | ORAL_TABLET | Freq: Once | ORAL | Status: AC
  Administered 2017-07-25: 5 mg via ORAL
  Filled 2017-07-25: qty 1

## 2017-07-25 MED ORDER — LISINOPRIL 20 MG PO TABS
20.0000 mg | ORAL_TABLET | Freq: Once | ORAL | Status: AC
  Administered 2017-07-25: 20 mg via ORAL
  Filled 2017-07-25: qty 1

## 2017-07-25 NOTE — Discharge Instructions (Signed)
Please read and follow all provided instructions.  Your diagnoses today include:  1. Lightheadedness   2. Essential hypertension     Your blood pressure was high today (BP): BP (!) 157/91    Pulse 71    Temp 97.9 F (36.6 C)    Resp 12    Ht 5' 2.5" (1.588 m)    Wt 68.9 kg (152 lb)    SpO2 100%    BMI 27.36 kg/m   Tests performed today include:  Vital signs. See below for your results today.   Blood counts and electrolytes - normal  EKG - no signs of heart irritation or abnormal heart rhythm  Medications prescribed:   None  Home care instructions:  Follow any educational materials contained in this packet.  Follow-up instructions: Please follow-up with your primary care provider in the next 3 days for a recheck of your symptoms and blood pressure.    Return instructions:   Please return to the Emergency Department if you experience worsening symptoms.   Return with severe chest pain, abdominal pain, or shortness of breath.   Return with severe headache, focal weakness, numbness, difficulty with speech or vision.  Return with loss of vision or sudden vision change.  Please return if you have any other emergent concerns.  Additional Information:  Your vital signs today were: BP (!) 157/91    Pulse 71    Temp 97.9 F (36.6 C)    Resp 12    Ht 5' 2.5" (1.588 m)    Wt 68.9 kg (152 lb)    SpO2 100%    BMI 27.36 kg/m  If your blood pressure (BP) was elevated above 135/85 this visit, please have this repeated by your doctor within one month. --------------

## 2017-07-25 NOTE — ED Provider Notes (Signed)
[facility]-EMERGENCY DEPT Provider Note   CSN: [id number] Arrival date & time: 07/25/17  1128     History   Chief Complaint Chief Complaint  Patient presents with  . Dizziness  . Hypertension    HPI RICCA MELGAREJO is a 56 y.o. female.  Patient with history of hypertension presents from her workplace after having several episodes of lightheadedness over, her this morning. No full syncope reported. Patient has had similar symptoms in the past when her blood pressure was elevated. She reports that symptoms started while she was standing. She felt lightheaded and needed to sit down. She was monitored by coworkers. Blood pressure found to be elevated. EMS was called. Patient was going to refuse transport, however when to use the bathroom and her blood pressure became more elevated, over 200 systolic, so patient agreed to evaluation. She did not have any associated headaches, vision change, chest pain, shortness of breath, nausea, vomiting, or diarrhea. She does report chills. She reports frequent urination, however this is because of bladder prolapse for which she is to have surgery and is unchanged from her normal urinary pattern. No dysuria. Patient does report that she did not take either lisinopril or amlodipine this morning. The onset of this condition was acute. The course is resolved. Aggravating factors: position. Alleviating factors: none.        Past Medical History:  Diagnosis Date  . GERD (gastroesophageal reflux disease)   . Hyperlipidemia   . Hypertension   . IBS (irritable bowel syndrome)   . Migraines   . TIA (transient ischemic attack)    MRI 02/03/10 normal. Echo 3/7/11normal EF,normal. Carotid doppler 02/05/10 normal. Cardionet moniitor-no AFIB    Patient Active Problem List   Diagnosis Date Noted  . Chronic migraine 10/02/2016  . Fatigue 10/02/2016  . Acute appendicitis 05/25/2015  . Appendicitis, acute s/p lap appendectomy 05/26/15 05/25/2015  . Essential  hypertension, benign 11/26/2013  . HTN (hypertension) 11/26/2013  . GERD (gastroesophageal reflux disease) 11/26/2013  . History of TIA (transient ischemic attack) 11/26/2013    Past Surgical History:  Procedure Laterality Date  . ABDOMINAL HYSTERECTOMY    . bladder tacking    . BUNIONECTOMY Bilateral   . COLONOSCOPY  09/2011   hyperplastic polyps,diverticula,repeat 10 years-Dr Magod  . LAPAROSCOPIC APPENDECTOMY N/A 05/25/2015   Procedure: APPENDECTOMY LAPAROSCOPIC;  Surgeon: Avel Peace, MD;  Location: [facility] ORS;  Service: General;  Laterality: N/A;    OB History    No data available       Home Medications    Prior to Admission medications   Medication Sig Start Date End Date Taking? Authorizing Provider  aspirin 325 MG tablet Take 325 mg by mouth at bedtime.     [provider]  fexofenadine (ALLEGRA) 180 MG tablet Take 180 mg by mouth daily as needed for allergies.     [provider]  HYDROcodone-acetaminophen (NORCO/VICODIN) 5-325 MG per tablet Take 1-2 tablets by mouth every 4 (four) hours as needed for moderate pain. 05/26/15   Barnetta Chapel, PA-C  ibuprofen (ADVIL,MOTRIN) 200 MG tablet Take 200-400 mg by mouth every 6 (six) hours as needed (For pain.).    [provider]  lansoprazole (PREVACID) 30 MG capsule Take 30 mg by mouth daily as needed (For heartburn or acid reflux.).  11/13/13   [provider]  lisinopril (PRINIVIL,ZESTRIL) 10 MG tablet Take 10 mg by mouth daily.    [provider]  nortriptyline (PAMELOR) 25 MG capsule Take 2 capsules (50 mg  total) by mouth at bedtime. 10/02/16   Levert Feinstein, MD  SUMAtriptan (IMITREX) 25 MG tablet Take 1 tablet (25 mg total) by mouth every 2 (two) hours as needed for migraine. May repeat in 2 hours if headache persists or recurs. 10/02/16   Levert Feinstein, MD  triamterene-hydrochlorothiazide (MAXZIDE-25) 37.5-25 MG tablet Take 1 tablet by mouth daily.    [provider]    Family  History Family History  Problem Relation Age of Onset  . Heart disease Mother   . Diabetes Father   . Throat cancer Father   . Stroke Maternal Grandfather   . Cancer Maternal Grandfather        unsure of type  . Cancer Paternal Grandfather        unsure of type  . Diabetes Sister   . Pancreatic cancer Sister     Social History Social History  Substance Use Topics  . Smoking status: Never Smoker  . Smokeless tobacco: Never Used  . Alcohol use No     Allergies   Other and Sulfa antibiotics   Review of Systems Review of Systems  Constitutional: Positive for chills. Negative for diaphoresis and fever.  Eyes: Negative for redness.  Respiratory: Negative for cough and shortness of breath.   Cardiovascular: Negative for chest pain, palpitations and leg swelling.  Gastrointestinal: Negative for abdominal pain, nausea and vomiting.  Genitourinary: Negative for dysuria.  Musculoskeletal: Negative for back pain and neck pain.  Skin: Negative for rash.  Neurological: Positive for light-headedness. Negative for syncope.  Psychiatric/Behavioral: The patient is not nervous/anxious.      Physical Exam Updated Vital Signs BP (!) 153/86   Pulse 71   Temp 97.9 F (36.6 C)   Resp 13   Ht 5' 2.5" (1.588 m)   Wt 68.9 kg (152 lb)   SpO2 100%   BMI 27.36 kg/m   Physical Exam  Constitutional: She is oriented to person, place, and time. She appears well-developed and well-nourished.  HENT:  Head: Normocephalic and atraumatic.  Right Ear: Tympanic membrane, external ear and ear canal normal.  Left Ear: Tympanic membrane, external ear and ear canal normal.  Nose: Nose normal.  Mouth/Throat: Uvula is midline, oropharynx is clear and moist and mucous membranes are normal. Mucous membranes are not dry.  Eyes: Pupils are equal, round, and reactive to light. Conjunctivae, EOM and lids are normal. Right eye exhibits no nystagmus. Left eye exhibits no nystagmus.  Neck: Trachea normal  and normal range of motion. Neck supple. Normal carotid pulses and no JVD present. No muscular tenderness present. Carotid bruit is not present. No tracheal deviation present.  Cardiovascular: Normal rate, regular rhythm, S1 normal, S2 normal, normal heart sounds and intact distal pulses.  Exam reveals no decreased pulses.   No murmur heard. Pulmonary/Chest: Effort normal and breath sounds normal. No respiratory distress. She has no wheezes. She exhibits no tenderness.  Abdominal: Soft. Normal aorta and bowel sounds are normal. There is no tenderness. There is no rebound and no guarding.  Musculoskeletal: Normal range of motion.       Cervical back: She exhibits normal range of motion, no tenderness and no bony tenderness.  Neurological: She is alert and oriented to person, place, and time. She has normal strength and normal reflexes. No cranial nerve deficit or sensory deficit. She displays a negative Romberg sign. Coordination and gait normal. GCS eye subscore is 4. GCS verbal subscore is 5. GCS motor subscore is 6.  Skin: Skin is warm  and dry. She is not diaphoretic. No cyanosis. No pallor.  Psychiatric: She has a normal mood and affect.  Nursing note and vitals reviewed.    ED Treatments / Results  Labs (all labs ordered are listed, but only abnormal results are displayed) Labs Reviewed  CBC  BASIC METABOLIC PANEL    EKG  EKG Interpretation None       Radiology No results found.  Procedures Procedures (including critical care time)  Medications Ordered in ED Medications  lisinopril (PRINIVIL,ZESTRIL) tablet 20 mg (20 mg Oral Given 07/25/17 1336)  amLODipine (NORVASC) tablet 5 mg (5 mg Oral Given 07/25/17 1337)     Initial Impression / Assessment and Plan / ED Course  I have reviewed the triage vital signs and the nursing notes.  Pertinent labs & imaging results that were available during my care of the patient were reviewed by me and considered in my medical decision  making (see chart for details).     Patient seen and examined. Work-up initiated. Medications ordered.   Vital signs reviewed and are as follows: BP (!) 153/86   Pulse 71   Temp 97.9 F (36.6 C)   Resp 13   Ht 5' 2.5" (1.588 m)   Wt 68.9 kg (152 lb)   SpO2 100%   BMI 27.36 kg/m   2:12 PM Patient updated on results. Blood pressure improving. She wants to go home. Labwork reassuring. Will discharge to home. Encouraged patient to rest and continue take her blood pressure medications as prescribed.  She should return to the emergency department with chest pain, shortness of breath, additional episodes of lightheadedness or if she Passes out completely. Patient counseled to return if they have weakness in their arms or legs, slurred speech, trouble walking or talking, confusion, trouble with their balance, or if they have any other concerns. Patient verbalizes understanding and agrees with plan.    Final Clinical Impressions(s) / ED Diagnoses   Final diagnoses:  Lightheadedness  Essential hypertension   Patient with vague episodes of lightheadedness, no full syncope, chest pain, shortness of breath in the setting of elevated blood pressures. This has happened to her in the past with elevated blood pressures. EKG without signs of ischemia, prolonged QTC, Brugada syndrome, reentrant tachycardia. Electrolytes are within normal limits. Patient is at her baseline at time of discharge with no lightheadedness or chest pain. Feel safe for discharge to home and patient also does not what to stay here. She will follow-up with her primary care return with any worsening or new symptoms.  New Prescriptions New Prescriptions   No medications on file     Renne Crigler, Cordelia Poche 07/25/17 1414    Mesner, Barbara Cower, MD 07/27/17 [phone]

## 2017-07-25 NOTE — ED Triage Notes (Signed)
Per EMS, patient from work and became dizzy.  Dizziness persistent for 1.5 hours.  Hx of hypertension and TIA (7 years ago). Patient did not take lisinopril this morning.  210/120, 72 HR, RR 16, CBG 95.  NIH 0.  Has not seen PCP x 4 months.  Increased hypertension meds at last appointment.  20G LFA.  12 lead NSR.  No SOB, N/V, diaphoresis, or chest pain.

## 2017-07-28 DIAGNOSIS — R42 Dizziness and giddiness: Secondary | ICD-10-CM | POA: Diagnosis not present

## 2017-07-28 DIAGNOSIS — I10 Essential (primary) hypertension: Secondary | ICD-10-CM | POA: Diagnosis not present

## 2017-08-07 DIAGNOSIS — I10 Essential (primary) hypertension: Secondary | ICD-10-CM | POA: Diagnosis not present

## 2017-08-07 DIAGNOSIS — K219 Gastro-esophageal reflux disease without esophagitis: Secondary | ICD-10-CM | POA: Diagnosis not present

## 2017-08-15 DIAGNOSIS — N816 Rectocele: Secondary | ICD-10-CM | POA: Diagnosis not present

## 2017-08-15 DIAGNOSIS — K469 Unspecified abdominal hernia without obstruction or gangrene: Secondary | ICD-10-CM | POA: Diagnosis not present

## 2017-08-15 DIAGNOSIS — N819 Female genital prolapse, unspecified: Secondary | ICD-10-CM | POA: Diagnosis not present

## 2017-08-15 DIAGNOSIS — K5902 Outlet dysfunction constipation: Secondary | ICD-10-CM | POA: Diagnosis not present

## 2017-08-27 DIAGNOSIS — K469 Unspecified abdominal hernia without obstruction or gangrene: Secondary | ICD-10-CM | POA: Diagnosis not present

## 2017-09-08 DIAGNOSIS — N816 Rectocele: Secondary | ICD-10-CM | POA: Diagnosis not present

## 2017-09-08 DIAGNOSIS — N819 Female genital prolapse, unspecified: Secondary | ICD-10-CM | POA: Diagnosis not present

## 2017-09-08 DIAGNOSIS — K5902 Outlet dysfunction constipation: Secondary | ICD-10-CM | POA: Diagnosis not present

## 2017-09-08 DIAGNOSIS — N8110 Cystocele, unspecified: Secondary | ICD-10-CM | POA: Diagnosis not present

## 2017-09-08 DIAGNOSIS — N736 Female pelvic peritoneal adhesions (postinfective): Secondary | ICD-10-CM | POA: Diagnosis not present

## 2017-09-11 DIAGNOSIS — R339 Retention of urine, unspecified: Secondary | ICD-10-CM | POA: Diagnosis not present

## 2017-11-28 DIAGNOSIS — Z1231 Encounter for screening mammogram for malignant neoplasm of breast: Secondary | ICD-10-CM | POA: Diagnosis not present

## 2018-01-28 DIAGNOSIS — N952 Postmenopausal atrophic vaginitis: Secondary | ICD-10-CM | POA: Diagnosis not present

## 2018-02-05 DIAGNOSIS — Z6827 Body mass index (BMI) 27.0-27.9, adult: Secondary | ICD-10-CM | POA: Diagnosis not present

## 2018-02-05 DIAGNOSIS — Z13820 Encounter for screening for osteoporosis: Secondary | ICD-10-CM | POA: Diagnosis not present

## 2018-02-05 DIAGNOSIS — Z01419 Encounter for gynecological examination (general) (routine) without abnormal findings: Secondary | ICD-10-CM | POA: Diagnosis not present

## 2018-03-02 DIAGNOSIS — H6120 Impacted cerumen, unspecified ear: Secondary | ICD-10-CM | POA: Diagnosis not present

## 2018-03-02 DIAGNOSIS — H9209 Otalgia, unspecified ear: Secondary | ICD-10-CM | POA: Diagnosis not present

## 2018-04-15 DIAGNOSIS — S29012A Strain of muscle and tendon of back wall of thorax, initial encounter: Secondary | ICD-10-CM | POA: Diagnosis not present

## 2018-04-15 DIAGNOSIS — M62838 Other muscle spasm: Secondary | ICD-10-CM | POA: Diagnosis not present

## 2018-07-02 DIAGNOSIS — K219 Gastro-esophageal reflux disease without esophagitis: Secondary | ICD-10-CM | POA: Diagnosis not present

## 2018-07-02 DIAGNOSIS — E559 Vitamin D deficiency, unspecified: Secondary | ICD-10-CM | POA: Diagnosis not present

## 2018-07-02 DIAGNOSIS — G43909 Migraine, unspecified, not intractable, without status migrainosus: Secondary | ICD-10-CM | POA: Diagnosis not present

## 2018-07-02 DIAGNOSIS — I10 Essential (primary) hypertension: Secondary | ICD-10-CM | POA: Diagnosis not present

## 2018-09-27 DIAGNOSIS — Z23 Encounter for immunization: Secondary | ICD-10-CM | POA: Diagnosis not present

## 2018-10-21 DIAGNOSIS — N952 Postmenopausal atrophic vaginitis: Secondary | ICD-10-CM | POA: Diagnosis not present

## 2018-12-01 DIAGNOSIS — Z1231 Encounter for screening mammogram for malignant neoplasm of breast: Secondary | ICD-10-CM | POA: Diagnosis not present

## 2019-01-07 DIAGNOSIS — R002 Palpitations: Secondary | ICD-10-CM | POA: Diagnosis not present

## 2019-01-13 ENCOUNTER — Encounter: Payer: Self-pay | Admitting: Cardiology

## 2019-01-13 ENCOUNTER — Ambulatory Visit (INDEPENDENT_AMBULATORY_CARE_PROVIDER_SITE_OTHER): Payer: 59 | Admitting: Cardiology

## 2019-01-13 VITALS — BP 110/70 | HR 66 | Ht 62.5 in | Wt 157.0 lb

## 2019-01-13 DIAGNOSIS — I10 Essential (primary) hypertension: Secondary | ICD-10-CM | POA: Diagnosis not present

## 2019-01-13 DIAGNOSIS — R002 Palpitations: Secondary | ICD-10-CM

## 2019-01-13 NOTE — Patient Instructions (Addendum)
Medication Instructions:  DECREASE- Aspirin 81 mg daily  If you need a refill on your cardiac medications before your next appointment, please call your pharmacy.  Labwork: None Ordered   Take the provided lab slips with you to the lab for your blood draw.   When you have your labs (blood work) drawn today and your tests are completely normal, you will receive your results only by MyChart Message (if you have MyChart) -OR-  A paper copy in the mail.  If you have any lab test that is abnormal or we need to change your treatment, we will call you to review these results.  Testing/Procedures: None Ordered  Special Instructions: ALIVECOR  Follow-Up: . Your physician recommends that you schedule a follow-up appointment in: As Needed   At [facility], you and your health needs are our priority.  As part of our continuing mission to provide you with exceptional heart care, we have created designated Provider Care Teams.  These Care Teams include your primary Cardiologist (physician) and Advanced Practice Providers (APPs -  Physician Assistants and Nurse Practitioners) who all work together to provide you with the care you need, when you need it.  Thank you for choosing [facility] at [facility]!!

## 2019-01-13 NOTE — Progress Notes (Signed)
Cardiology Office Note   Date:  01/13/2019   ID:  Mackenzie Carr, DOB Nov 02, 1961, MRN [id number]  PCP:  Merri BrunetteSmith, Candace, MD  Cardiologist:   No primary care provider on file. Referring:  Merri BrunetteSmith, Candace, MD  Chief Complaint  Patient presents with  . Palpitations      History of Present Illness: Mackenzie Carr is a 58 y.o. female who is referred by Merri BrunetteSmith, Candace, MD for evaluation of palpitations.  She was seen in 2014 in our group because of sudden discomfort.  This was felt to be somewhat atypical.  She had some palpitations and had a Holter that was unremarkable.  She did have a negative POET (Plain Old Exercise Treadmill) in 2014.  Was also on echocardiogram in 2017 which demonstrated no significant abnormalities with well-preserved ejection fraction.    She said at the time when she was having all these evaluations she would have sudden spikes in her pressure and heart rate.  It was never clear etiology.  Eventually this resolved.  She has not really been bothered by this over a couple of years.  However, recently she started an exercise program at work.  She noticed that a day or so after doing that she had an episode of her heart racing.  She actually had a sudden episode 1 time where she went up to her company nurse and was noted to have a heart rate that was fluctuating from 87-1 23 on her Fitbit but there was no EKG obtainable.  Her blood pressure was okay.  She took a couple of days off of work and she actually has felt better.  She is not had any significant recurrence of this.  She gets about 8000 steps a day and she has not had any chest pressure, neck or arm discomfort.  She is not had any presyncope or syncope.  She is had no new shortness of breath, PND or orthopnea.  She has had no weight gain or edema.  Past Medical History:  Diagnosis Date  . GERD (gastroesophageal reflux disease)   . Hyperlipidemia   . Hypertension   . IBS (irritable bowel syndrome)    . Migraines   . TIA (transient ischemic attack)    MRI 02/03/10 normal. Echo 3/7/11normal EF,normal. Carotid doppler 02/05/10 normal. Cardionet moniitor-no AFIB    Past Surgical History:  Procedure Laterality Date  . ABDOMINAL HYSTERECTOMY    . bladder tacking    . BUNIONECTOMY Bilateral   . COLONOSCOPY  09/2011   hyperplastic polyps,diverticula,repeat 10 years-Dr Magod  . LAPAROSCOPIC APPENDECTOMY N/A 05/25/2015   Procedure: APPENDECTOMY LAPAROSCOPIC;  Surgeon: Avel Peaceodd Rosenbower, MD;  Location: [facility];  Service: General;  Laterality: N/A;     Current Outpatient Medications  Medication Sig Dispense Refill  . amLODipine (NORVASC) 10 MG tablet Take 10 mg by mouth daily.     Marland Kitchen. aspirin EC 81 MG tablet Take 81 mg by mouth daily.    . fexofenadine (ALLEGRA) 180 MG tablet Take 180 mg by mouth daily as needed for allergies.     . hydrochlorothiazide (HYDRODIURIL) 25 MG tablet Take by mouth.    Marland Kitchen. HYDROcodone-acetaminophen (NORCO/VICODIN) 5-325 MG per tablet Take 1-2 tablets by mouth every 4 (four) hours as needed for moderate pain. 40 tablet 0  . ibuprofen (ADVIL,MOTRIN) 200 MG tablet Take 200-400 mg by mouth every 6 (six) hours as needed (For pain.).    Marland Kitchen. lisinopril (PRINIVIL,ZESTRIL) 20 MG tablet Take 20 mg by mouth daily.    .Marland Kitchen  nortriptyline (PAMELOR) 25 MG capsule Take 2 capsules (50 mg total) by mouth at bedtime. 60 capsule 11  . pantoprazole (PROTONIX) 40 MG tablet Take 40 mg by mouth daily.    . SUMAtriptan (IMITREX) 25 MG tablet Take 1 tablet (25 mg total) by mouth every 2 (two) hours as needed for migraine. May repeat in 2 hours if headache persists or recurs. 15 tablet 11  . Potassium Chloride ER 20 MEQ TBCR      No current facility-administered medications for this visit.     Allergies:   Other and Sulfa antibiotics    Social History:  The patient  reports that she has never smoked. She has never used smokeless tobacco. She reports that she does not drink alcohol or use drugs.    Family History:  The patient's family history includes Cancer in her maternal grandfather and paternal grandfather; Diabetes in her father and sister; Heart disease in her mother; Pancreatic cancer in her sister; Stroke in her maternal grandfather; Throat cancer in her father.    ROS:  Please see the history of present illness.   Otherwise, review of systems are positive for none.   All other systems are reviewed and negative.    PHYSICAL EXAM: VS:  BP 110/70   Pulse 66   Ht 5' 2.5" (1.588 m)   Wt 157 lb (71.2 kg)   BMI 28.26 kg/m  , BMI Body mass index is 28.26 kg/m. GENERAL:  Well appearing HEENT:  Pupils equal round and reactive, fundi not visualized, oral mucosa unremarkable NECK:  No jugular venous distention, waveform within normal limits, carotid upstroke brisk and symmetric, no bruits, no thyromegaly LYMPHATICS:  No cervical, inguinal adenopathy LUNGS:  Clear to auscultation bilaterally BACK:  No CVA tenderness CHEST:  Unremarkable HEART:  PMI not displaced or sustained,S1 and S2 within normal limits, no S3, no S4, no clicks, no rubs, no murmurs ABD:  Flat, positive bowel sounds normal in frequency in pitch, no bruits, no rebound, no guarding, no midline pulsatile mass, no hepatomegaly, no splenomegaly EXT:  2 plus pulses throughout, no edema, no cyanosis no clubbing SKIN:  No rashes no nodules NEURO:  Cranial nerves II through XII grossly intact, motor grossly intact throughout PSYCH:  Cognitively intact, oriented to person place and time    EKG:  EKG is ordered today. The ekg ordered today demonstrates sinus rhythm, rate 66, axis within normal limits, intervals within normal limits, no acute ST-T wave changes.   Recent Labs: No results found for requested labs within last 8760 hours.    Lipid Panel    Component Value Date/Time   CHOL  02/04/2010 1144    174        ATP III CLASSIFICATION:  <200     mg/dL   Desirable  284-132  mg/dL   Borderline High  >=440     mg/dL   High          TRIG 57 02/04/2010 1144   HDL 37 (L) 02/04/2010 1144   CHOLHDL 4.7 02/04/2010 1144   VLDL 11 02/04/2010 1144   LDLCALC (H) 02/04/2010 1144    126        Total Cholesterol/HDL:CHD Risk Coronary Heart Disease Risk Table                     Men   Women  1/2 Average Risk   3.4   3.3  Average Risk       5.0  4.4  2 X Average Risk   9.6   7.1  3 X Average Risk  23.4   11.0        Use the calculated Patient Ratio above and the CHD Risk Table to determine the patient's CHD Risk.        ATP III CLASSIFICATION (LDL):  <100     mg/dL   Optimal  440-347  mg/dL   Near or Above                    Optimal  130-159  mg/dL   Borderline  425-956  mg/dL   High  >387     mg/dL   Very High      Wt Readings from Last 3 Encounters:  01/13/19 157 lb (71.2 kg)  07/25/17 152 lb (68.9 kg)  10/02/16 158 lb 4 oz (71.8 kg)      Other studies Reviewed: Additional studies/ records that were reviewed today include: Previous cardiac work up. . Review of the above records demonstrates:  Please see elsewhere in the note.  See above   ASSESSMENT AND PLAN:  PALPITATIONS: The patient had tachypalpitations but has not had any symptomatic recurrence of this.  She is had a negative work-up previously.  At this point I do not think further testing would be helpful as she is not had recurrent symptoms although I suggested that she could use [employer] [facility].  If she has more frequent tachypalpitations I would put on an event monitor and she will let me know.  I do note that she had normal electrolytes except for a very slightly reduced potassium recently.  She had normal TSH.  HTN:  The blood pressure is at target. No change in medications is indicated. We will continue with therapeutic lifestyle changes (TLC).  Current medicines are reviewed at length with the patient today.  The patient does not have concerns regarding medicines.  The following changes have been made:  no change  Labs/  tests ordered today include: None  Orders Placed This Encounter  Procedures  . EKG 12-Lead     Disposition:   FU with me as needed     Signed, Rollene Rotunda, MD  01/13/2019 5:33 PM    [facility]

## 2019-01-18 DIAGNOSIS — Z0000 Encounter for general adult medical examination without abnormal findings: Secondary | ICD-10-CM | POA: Diagnosis not present

## 2019-01-18 DIAGNOSIS — K219 Gastro-esophageal reflux disease without esophagitis: Secondary | ICD-10-CM | POA: Diagnosis not present

## 2019-01-18 DIAGNOSIS — G43909 Migraine, unspecified, not intractable, without status migrainosus: Secondary | ICD-10-CM | POA: Diagnosis not present

## 2019-01-18 DIAGNOSIS — I10 Essential (primary) hypertension: Secondary | ICD-10-CM | POA: Diagnosis not present

## 2019-01-18 DIAGNOSIS — E7800 Pure hypercholesterolemia, unspecified: Secondary | ICD-10-CM | POA: Diagnosis not present

## 2019-01-28 ENCOUNTER — Ambulatory Visit: Payer: 59 | Admitting: Cardiology

## 2019-02-08 DIAGNOSIS — Z6827 Body mass index (BMI) 27.0-27.9, adult: Secondary | ICD-10-CM | POA: Diagnosis not present

## 2019-02-08 DIAGNOSIS — Z01419 Encounter for gynecological examination (general) (routine) without abnormal findings: Secondary | ICD-10-CM | POA: Diagnosis not present

## 2019-06-07 DIAGNOSIS — M5136 Other intervertebral disc degeneration, lumbar region: Secondary | ICD-10-CM | POA: Insufficient documentation

## 2020-08-15 ENCOUNTER — Ambulatory Visit (INDEPENDENT_AMBULATORY_CARE_PROVIDER_SITE_OTHER): Payer: 59

## 2020-08-15 ENCOUNTER — Ambulatory Visit (INDEPENDENT_AMBULATORY_CARE_PROVIDER_SITE_OTHER): Payer: 59 | Admitting: Podiatry

## 2020-08-15 ENCOUNTER — Other Ambulatory Visit: Payer: Self-pay

## 2020-08-15 ENCOUNTER — Encounter: Payer: Self-pay | Admitting: Podiatry

## 2020-08-15 DIAGNOSIS — M722 Plantar fascial fibromatosis: Secondary | ICD-10-CM

## 2020-08-15 DIAGNOSIS — M7752 Other enthesopathy of left foot: Secondary | ICD-10-CM | POA: Diagnosis not present

## 2020-08-15 DIAGNOSIS — M7751 Other enthesopathy of right foot: Secondary | ICD-10-CM

## 2020-08-15 DIAGNOSIS — M21961 Unspecified acquired deformity of right lower leg: Secondary | ICD-10-CM | POA: Diagnosis not present

## 2020-08-15 DIAGNOSIS — M21962 Unspecified acquired deformity of left lower leg: Secondary | ICD-10-CM | POA: Diagnosis not present

## 2020-08-15 DIAGNOSIS — M79672 Pain in left foot: Secondary | ICD-10-CM

## 2020-08-15 DIAGNOSIS — M79671 Pain in right foot: Secondary | ICD-10-CM

## 2020-08-15 MED ORDER — METHYLPREDNISOLONE 4 MG PO TBPK
ORAL_TABLET | ORAL | 0 refills | Status: DC
Start: 2020-08-15 — End: 2022-07-11

## 2020-08-15 NOTE — Progress Notes (Signed)
Subjective:  Patient ID: Mackenzie Carr, female    DOB: [birth year],  MRN: [id number]  Chief Complaint  Patient presents with   Foot Pain    Pt states bilateral generalized plantar foot pain. Prominent pain locations include the plantar heel (4-6 week duration) and plantar forefoot (7yr duration). Pt denies any known injuries but admits she works on her feet all day.    59 y.o. female presents with the above complaint. History confirmed with patient.   Objective:  Physical Exam: warm, good capillary refill, no trophic changes or ulcerative lesions, normal DP and PT pulses and normal sensory exam. Left Foot: POP medial calcaneal tuber, 2nd/3rd MPJ  Right Foot: POP medial calcaneal tuber, 2nd/3rd MPJ   No images are attached to the encounter.  Radiographs: X-ray of both feet: no fracture, dislocation, swelling or degenerative changes noted, plantar calcaneal spur, posterior calcaneal spur and Haglund deformity noted Shortened 1st met / elongated 2nd/3rd mets Assessment:   1. Plantar fasciitis   2. Capsulitis of metatarsophalangeal (MTP) joints of both feet   3. Deformity of metatarsal bone of left foot   4. Deformity of metatarsal bone of right foot    Plan:  Patient was evaluated and treated and all questions answered.  Capsulitis and Plantar Fasciitis -Educated on etiology -XR reviewed with patient -Educated patient on stretching and icing of the affected limb -Night splint dispensed -Plantar fascial brace dispensed  No follow-ups on file.

## 2020-08-15 NOTE — Patient Instructions (Signed)

## 2020-09-15 ENCOUNTER — Other Ambulatory Visit: Payer: Self-pay

## 2020-09-15 ENCOUNTER — Ambulatory Visit (INDEPENDENT_AMBULATORY_CARE_PROVIDER_SITE_OTHER): Payer: 59 | Admitting: Podiatry

## 2020-09-15 DIAGNOSIS — M7751 Other enthesopathy of right foot: Secondary | ICD-10-CM | POA: Diagnosis not present

## 2020-09-15 DIAGNOSIS — M722 Plantar fascial fibromatosis: Secondary | ICD-10-CM

## 2020-09-15 DIAGNOSIS — M7752 Other enthesopathy of left foot: Secondary | ICD-10-CM

## 2020-09-15 MED ORDER — MELOXICAM 15 MG PO TABS: 15.0000 mg | ORAL_TABLET | Freq: Every day | ORAL | 0 refills | Status: AC

## 2020-10-02 NOTE — Progress Notes (Signed)
  Subjective:  Patient ID: Mackenzie Carr, female    DOB: 07/17/1961,  MRN: [id number]  Chief Complaint  Patient presents with  . Plantar Fasciitis    follow-up, recommended therapy not helpful     59 y.o. female presents with the above complaint. History confirmed with patient.  States that standing makes the pain worse wears the plantar fascial braces at work but the feet and not getting any better reports pain 10 a 10 when standing for long periods at work using a night splint and stretching icing as directed the steroids helped for short period of time  Objective:  Physical Exam: warm, good capillary refill, no trophic changes or ulcerative lesions, normal DP and PT pulses and normal sensory exam. Left Foot: POP medial calcaneal tuber, 2nd/3rd MPJ  Right Foot: POP medial calcaneal tuber, 2nd/3rd MPJ   No images are attached to the encounter.  Radiographs: X-ray of both feet: no fracture, dislocation, swelling or degenerative changes noted, plantar calcaneal spur, posterior calcaneal spur and Haglund deformity noted Shortened 1st met / elongated 2nd/3rd mets Assessment:   1. Plantar fasciitis   2. Capsulitis of metatarsophalangeal (MTP) joints of both feet    Plan:  Patient was evaluated and treated and all questions answered.  Capsulitis and Plantar Fasciitis -Educated on etiology -Rx meloxicam -could consider surgical intervention should pain persist  Return in about 4 weeks (around 10/13/2020) for Plantar fasciitis.

## 2020-10-14 ENCOUNTER — Encounter: Payer: Self-pay | Admitting: Podiatry

## 2020-10-14 ENCOUNTER — Ambulatory Visit (INDEPENDENT_AMBULATORY_CARE_PROVIDER_SITE_OTHER): Payer: 59 | Admitting: Podiatry

## 2020-10-14 ENCOUNTER — Other Ambulatory Visit: Payer: Self-pay

## 2020-10-14 DIAGNOSIS — M7751 Other enthesopathy of right foot: Secondary | ICD-10-CM

## 2020-10-14 DIAGNOSIS — M722 Plantar fascial fibromatosis: Secondary | ICD-10-CM | POA: Diagnosis not present

## 2020-10-14 DIAGNOSIS — M7752 Other enthesopathy of left foot: Secondary | ICD-10-CM | POA: Diagnosis not present

## 2020-10-14 DIAGNOSIS — M21962 Unspecified acquired deformity of left lower leg: Secondary | ICD-10-CM

## 2020-10-14 NOTE — Progress Notes (Signed)
  Subjective:  Patient ID: Mackenzie Carr, female    DOB: Aug 06, 1961,  MRN: [id number]  Chief Complaint  Patient presents with  . Plantar Fasciitis    both of the heels are hurting and the injection only helped fof a little while    59 y.o. female presents with the above complaint. History confirmed with patient.     Objective:  Physical Exam: warm, good capillary refill, no trophic changes or ulcerative lesions, normal DP and PT pulses and normal sensory exam. Left Foot: POP medial calcaneal tuber, 2nd/3rd MPJ  Right Foot: POP medial calcaneal tuber, 2nd/3rd MPJ   No images are attached to the encounter.  9/14 Radiographs: X-ray of both feet: no fracture, dislocation, swelling or degenerative changes noted, plantar calcaneal spur, posterior calcaneal spur and Haglund deformity noted Shortened 1st met / elongated 2nd/3rd mets Assessment:   1. Plantar fasciitis   2. Capsulitis of metatarsophalangeal (MTP) joints of both feet   3. Deformity of metatarsal bone of left foot    Plan:  Patient was evaluated and treated and all questions answered.  Capsulitis and Plantar Fasciitis   -Patient has failed all conservative therapy and wishes to proceed with surgical intervention. All risks, benefits, and alternatives discussed with patient. No guarantees given. Consent reviewed and signed by patient. -Planned procedures: left 1st met ROH, 2nd/3rd shortening osteotomy, EPF  -Boot dispensed.  No follow-ups on file.

## 2020-10-19 ENCOUNTER — Other Ambulatory Visit: Payer: Self-pay | Admitting: Podiatry

## 2020-10-19 DIAGNOSIS — M7752 Other enthesopathy of left foot: Secondary | ICD-10-CM

## 2020-10-19 DIAGNOSIS — M7751 Other enthesopathy of right foot: Secondary | ICD-10-CM

## 2020-12-06 DIAGNOSIS — M79676 Pain in unspecified toe(s): Secondary | ICD-10-CM

## 2020-12-14 ENCOUNTER — Telehealth: Payer: Self-pay

## 2020-12-14 NOTE — Telephone Encounter (Signed)
DOS 12/20/2020  METATARSAL OSTEOTOMY 2,5 LT - 28308 REMOVAL FIXATION DEEP LT - 20680 EPF LT - 54656  RECEIVED FAX FROM [facility] [phone], 20680 & [phone].  AUTH # [id number] GOOD FROM 12/20/2020 - 01/17/2021

## 2020-12-20 ENCOUNTER — Encounter: Payer: Self-pay | Admitting: Podiatry

## 2020-12-20 ENCOUNTER — Other Ambulatory Visit: Payer: Self-pay | Admitting: Podiatry

## 2020-12-20 DIAGNOSIS — M722 Plantar fascial fibromatosis: Secondary | ICD-10-CM

## 2020-12-20 DIAGNOSIS — M21542 Acquired clubfoot, left foot: Secondary | ICD-10-CM

## 2020-12-20 DIAGNOSIS — Z4889 Encounter for other specified surgical aftercare: Secondary | ICD-10-CM | POA: Diagnosis not present

## 2020-12-20 MED ORDER — CEPHALEXIN 500 MG PO CAPS: 500.0000 mg | ORAL_CAPSULE | Freq: Two times a day (BID) | ORAL | 0 refills | Status: DC

## 2020-12-20 MED ORDER — OXYCODONE-ACETAMINOPHEN 5-325 MG PO TABS: 1.0000 | ORAL_TABLET | ORAL | 0 refills | Status: DC | PRN

## 2020-12-20 MED ORDER — ONDANSETRON HCL 4 MG PO TABS: 4.0000 mg | ORAL_TABLET | Freq: Three times a day (TID) | ORAL | 0 refills | Status: DC | PRN

## 2020-12-20 NOTE — Progress Notes (Signed)
Rx sent to pharmacy for outpatient surgery. °

## 2020-12-21 ENCOUNTER — Telehealth: Payer: Self-pay | Admitting: Podiatry

## 2020-12-21 NOTE — Telephone Encounter (Signed)
Called patient for post-op check. Denies issues. Pain controlled with medications.

## 2020-12-25 ENCOUNTER — Telehealth: Payer: Self-pay | Admitting: Podiatry

## 2020-12-25 MED ORDER — FLUCONAZOLE 150 MG PO TABS: 150.0000 mg | ORAL_TABLET | Freq: Once | ORAL | 0 refills | Status: AC

## 2020-12-25 NOTE — Addendum Note (Signed)
Addended by: Ventura Sellers on: 12/25/2020 11:28 AM   Modules accepted: Orders

## 2020-12-25 NOTE — Telephone Encounter (Signed)
Pt is requesting an Rx for yeast infections. She states she gets them whenever she takes this medication. Please advise.

## 2020-12-26 ENCOUNTER — Other Ambulatory Visit: Payer: Self-pay

## 2020-12-26 ENCOUNTER — Ambulatory Visit (INDEPENDENT_AMBULATORY_CARE_PROVIDER_SITE_OTHER): Payer: No Typology Code available for payment source | Admitting: Podiatry

## 2020-12-26 ENCOUNTER — Other Ambulatory Visit: Payer: Self-pay | Admitting: Podiatry

## 2020-12-26 ENCOUNTER — Ambulatory Visit (INDEPENDENT_AMBULATORY_CARE_PROVIDER_SITE_OTHER): Payer: No Typology Code available for payment source

## 2020-12-26 DIAGNOSIS — Z9889 Other specified postprocedural states: Secondary | ICD-10-CM

## 2020-12-26 DIAGNOSIS — K580 Irritable bowel syndrome with diarrhea: Secondary | ICD-10-CM | POA: Insufficient documentation

## 2020-12-26 DIAGNOSIS — G459 Transient cerebral ischemic attack, unspecified: Secondary | ICD-10-CM | POA: Insufficient documentation

## 2020-12-26 DIAGNOSIS — M7752 Other enthesopathy of left foot: Secondary | ICD-10-CM

## 2020-12-26 DIAGNOSIS — M7751 Other enthesopathy of right foot: Secondary | ICD-10-CM | POA: Diagnosis not present

## 2020-12-26 DIAGNOSIS — M21962 Unspecified acquired deformity of left lower leg: Secondary | ICD-10-CM

## 2020-12-26 DIAGNOSIS — F4320 Adjustment disorder, unspecified: Secondary | ICD-10-CM | POA: Insufficient documentation

## 2020-12-26 DIAGNOSIS — R0789 Other chest pain: Secondary | ICD-10-CM | POA: Insufficient documentation

## 2020-12-26 DIAGNOSIS — G43009 Migraine without aura, not intractable, without status migrainosus: Secondary | ICD-10-CM | POA: Insufficient documentation

## 2020-12-26 DIAGNOSIS — N8110 Cystocele, unspecified: Secondary | ICD-10-CM | POA: Insufficient documentation

## 2020-12-26 DIAGNOSIS — E7800 Pure hypercholesterolemia, unspecified: Secondary | ICD-10-CM | POA: Insufficient documentation

## 2020-12-26 DIAGNOSIS — E559 Vitamin D deficiency, unspecified: Secondary | ICD-10-CM | POA: Insufficient documentation

## 2020-12-26 DIAGNOSIS — M722 Plantar fascial fibromatosis: Secondary | ICD-10-CM

## 2020-12-26 DIAGNOSIS — B9681 Helicobacter pylori [H. pylori] as the cause of diseases classified elsewhere: Secondary | ICD-10-CM | POA: Insufficient documentation

## 2020-12-26 MED ORDER — FLUCONAZOLE 150 MG PO TABS: 150.0000 mg | ORAL_TABLET | Freq: Once | ORAL | 0 refills | Status: AC

## 2020-12-26 MED ORDER — OXYCODONE-ACETAMINOPHEN 5-325 MG PO TABS: 1.0000 | ORAL_TABLET | ORAL | 0 refills | Status: DC | PRN

## 2020-12-26 NOTE — Progress Notes (Signed)
  Subjective:  Patient ID: Mackenzie Carr, female    DOB: 1961-05-02,  MRN: [id number]  Chief Complaint  Patient presents with  . Routine Post Op    POV#1    DOS: 12/21/19 Procedure: ROH, 2nd/3rd Weil Osteotomy and Endoscopic Plantar Fasciotomy left  60 y.o. female presents with the above complaint. History confirmed with patient. Doing well having pain but it is controlled with her medication. Does complain of yeast infection from the antibiotic  Objective:  Physical Exam: tenderness at the surgical site, local edema noted and calf supple, nontender. Incision: healing well, no significant drainage, no dehiscence, no significant erythema  No images are attached to the encounter. Assessment:   1. Plantar fasciitis   2. Capsulitis of metatarsophalangeal (MTP) joints of both feet   3. Deformity of metatarsal bone of left foot   4. Post-operative state     Plan:  Patient was evaluated and treated and all questions answered.  Post-operative State -XR reviewed with patient -Dressing applied consisting of sterile gauze, kerlix and ACE bandage -WBAT in CAM boot -Pain medication refilled  Return in about 1 week (around 01/02/2021) for Post-Op (No XRs).

## 2021-01-02 ENCOUNTER — Other Ambulatory Visit: Payer: Self-pay

## 2021-01-02 ENCOUNTER — Ambulatory Visit (INDEPENDENT_AMBULATORY_CARE_PROVIDER_SITE_OTHER): Payer: No Typology Code available for payment source | Admitting: Podiatry

## 2021-01-02 DIAGNOSIS — Z9889 Other specified postprocedural states: Secondary | ICD-10-CM

## 2021-01-02 DIAGNOSIS — M722 Plantar fascial fibromatosis: Secondary | ICD-10-CM

## 2021-01-02 DIAGNOSIS — M7751 Other enthesopathy of right foot: Secondary | ICD-10-CM

## 2021-01-02 DIAGNOSIS — M7752 Other enthesopathy of left foot: Secondary | ICD-10-CM

## 2021-01-02 NOTE — Progress Notes (Signed)
  Subjective:  Patient ID: Mackenzie Carr, female    DOB: 12-Jun-1961,  MRN: [id number]  Chief Complaint  Patient presents with  . Routine Post Op     POV #2 DOS 12/20/2020 LT FOOT EPF, 2ND/3RD METATARSAL SHORTENING OSTEOTOMY, REMOVAL HARDWARE 1ST METATARSAL. Pt. Complains of soreness.     DOS: 12/21/19 Procedure: ROH, 2nd/3rd Weil Osteotomy and Endoscopic Plantar Fasciotomy left  60 y.o. female presents with the above complaint. History confirmed with patient. Pain controlled denies new issues.  Objective:  Physical Exam: tenderness at the surgical site, local edema noted and calf supple, nontender. Incision: healing well, no significant drainage, no dehiscence, no significant erythema  No images are attached to the encounter. Assessment:   1. Plantar fasciitis   2. Capsulitis of metatarsophalangeal (MTP) joints of both feet   3. Post-operative state     Plan:  Patient was evaluated and treated and all questions answered.  Post-operative State -Sutures removed -Ok to start showering at this time. Advised they cannot soak. -Dressing applied consisting of sterile gauze, kerlix and ACE bandage -WBAT in CAM boot  -Will remove remainder of sutures next week - some suture ends unable to be retrieved.  No follow-ups on file.

## 2021-01-08 ENCOUNTER — Encounter: Payer: Self-pay | Admitting: Podiatry

## 2021-01-09 ENCOUNTER — Encounter: Payer: No Typology Code available for payment source | Admitting: Podiatry

## 2021-01-11 ENCOUNTER — Other Ambulatory Visit: Payer: Self-pay | Admitting: Family Medicine

## 2021-01-11 ENCOUNTER — Other Ambulatory Visit: Payer: Self-pay

## 2021-01-11 ENCOUNTER — Ambulatory Visit
Admission: RE | Admit: 2021-01-11 | Discharge: 2021-01-11 | Disposition: A | Discharge status: Home / Self Care | Payer: No Typology Code available for payment source | Source: Ambulatory Visit | Attending: Family Medicine | Admitting: Family Medicine

## 2021-01-11 DIAGNOSIS — G4489 Other headache syndrome: Secondary | ICD-10-CM

## 2021-01-12 ENCOUNTER — Ambulatory Visit (INDEPENDENT_AMBULATORY_CARE_PROVIDER_SITE_OTHER): Payer: No Typology Code available for payment source | Admitting: Podiatry

## 2021-01-12 ENCOUNTER — Other Ambulatory Visit: Payer: Self-pay

## 2021-01-12 DIAGNOSIS — Z9889 Other specified postprocedural states: Secondary | ICD-10-CM

## 2021-01-12 DIAGNOSIS — R519 Headache, unspecified: Secondary | ICD-10-CM | POA: Insufficient documentation

## 2021-01-12 DIAGNOSIS — M7751 Other enthesopathy of right foot: Secondary | ICD-10-CM

## 2021-01-12 DIAGNOSIS — M722 Plantar fascial fibromatosis: Secondary | ICD-10-CM

## 2021-01-12 DIAGNOSIS — M7752 Other enthesopathy of left foot: Secondary | ICD-10-CM

## 2021-01-12 DIAGNOSIS — M21962 Unspecified acquired deformity of left lower leg: Secondary | ICD-10-CM

## 2021-01-12 NOTE — Progress Notes (Signed)
  Subjective:  Patient ID: Mackenzie Carr, female    DOB: [birth year],  MRN: [id number]  Chief Complaint  Patient presents with  . Routine Post Op    Pov#3 Stitch removal. Pt states no pedal concerns.    DOS: 12/21/19 Procedure: ROH, 2nd/3rd Weil Osteotomy and Endoscopic Plantar Fasciotomy left  60 y.o. female presents with the above complaint. History confirmed with patient. Has been having headaches that have been debilitating. Denies issues with her foot.  Objective:  Physical Exam: tenderness at the surgical site, local edema noted and calf supple, nontender. Incision: well healed.  No images are attached to the encounter. Assessment:   1. Plantar fasciitis   2. Capsulitis of metatarsophalangeal (MTP) joints of both feet   3. Post-operative state   4. Deformity of metatarsal bone of left foot     Plan:  Patient was evaluated and treated and all questions answered.  Post-operative State -Removed remaining sutures today -F/u in 2 weeks for new XR, progression of WB  No follow-ups on file.

## 2021-01-16 ENCOUNTER — Encounter: Payer: 59 | Admitting: Podiatry

## 2021-01-30 ENCOUNTER — Ambulatory Visit (INDEPENDENT_AMBULATORY_CARE_PROVIDER_SITE_OTHER): Payer: No Typology Code available for payment source

## 2021-01-30 ENCOUNTER — Other Ambulatory Visit: Payer: Self-pay | Admitting: Podiatry

## 2021-01-30 ENCOUNTER — Other Ambulatory Visit: Payer: Self-pay

## 2021-01-30 ENCOUNTER — Ambulatory Visit (INDEPENDENT_AMBULATORY_CARE_PROVIDER_SITE_OTHER): Payer: No Typology Code available for payment source | Admitting: Podiatry

## 2021-01-30 DIAGNOSIS — M7751 Other enthesopathy of right foot: Secondary | ICD-10-CM

## 2021-01-30 DIAGNOSIS — M7752 Other enthesopathy of left foot: Secondary | ICD-10-CM | POA: Diagnosis not present

## 2021-01-30 DIAGNOSIS — M722 Plantar fascial fibromatosis: Secondary | ICD-10-CM

## 2021-01-30 NOTE — Progress Notes (Signed)
  Subjective:  Patient ID: Mackenzie Carr, female    DOB: [birth year],  MRN: [id number]  Chief Complaint  Patient presents with  . Routine Post Op    Pt states pain is 8/10 when non weight bearing. When weight bearing patient pain is a 10/10. Pt took boot off this pass Saturday. Swelling , hurting started pt states that's when she started icing. Pt states it is also tender to touch.    DOS: 12/21/19 Procedure: ROH, 2nd/3rd Weil Osteotomy and Endoscopic Plantar Fasciotomy left  60 y.o. female presents with the above complaint. History confirmed with patient. States she got out of her boot Saturday and has been having a lot of pain since then.  Objective:  Physical Exam: tenderness at the surgical site, local edema noted and calf supple, nontender. Incision: well healed.  No images are attached to the encounter. Assessment:   1. Capsulitis of metatarsophalangeal (MTP) joints of both feet    Plan:  Patient was evaluated and treated and all questions answered.  Post-operative State -She appears to have progressed herself WB too quickly. Will put her into surgical shoe today. -Transition to normal shoegear in 2 weeks.  Return in about 4 weeks (around 02/27/2021) for Post-Op (No XRs).

## 2021-02-27 ENCOUNTER — Other Ambulatory Visit: Payer: Self-pay

## 2021-02-27 ENCOUNTER — Ambulatory Visit (INDEPENDENT_AMBULATORY_CARE_PROVIDER_SITE_OTHER): Payer: PRIVATE HEALTH INSURANCE | Admitting: Podiatry

## 2021-02-27 DIAGNOSIS — Z9889 Other specified postprocedural states: Secondary | ICD-10-CM

## 2021-02-27 DIAGNOSIS — M7752 Other enthesopathy of left foot: Secondary | ICD-10-CM

## 2021-02-27 DIAGNOSIS — M7751 Other enthesopathy of right foot: Secondary | ICD-10-CM

## 2021-02-27 DIAGNOSIS — M722 Plantar fascial fibromatosis: Secondary | ICD-10-CM

## 2021-02-27 NOTE — Progress Notes (Signed)
  Subjective:  Patient ID: Mackenzie Carr, female    DOB: 06-Nov-1961,  MRN: [id number]  Chief Complaint  Patient presents with  . Routine Post Op     4w post op no xray    DOS: 12/21/19 Procedure: ROH, 2nd/3rd Weil Osteotomy and Endoscopic Plantar Fasciotomy left  60 y.o. female presents with the above complaint. History confirmed with patient. States occasional pains in her foot but she is working on getting back into normal shoes. Does not feel like she can meet the demands of the work day yet as she cannot tolerate the shoe all day.  Objective:  Physical Exam: only mild tenderness at the surgical site, local edema noted and calf supple, nontender. Incision: well healed.  No images are attached to the encounter. Assessment:   1. Capsulitis of metatarsophalangeal (MTP) joints of both feet   2. Plantar fasciitis   3. Post-operative state    Plan:  Patient was evaluated and treated and all questions answered.  Post-operative State -Doing well, continue transition into normal shoegear. -Plan for return to work in 2 weeks  Return in about 6 weeks (around 04/10/2021) for Post-Op (with XRs).

## 2021-03-12 ENCOUNTER — Encounter: Payer: Self-pay | Admitting: Podiatry

## 2021-04-10 ENCOUNTER — Other Ambulatory Visit: Payer: Self-pay

## 2021-04-10 ENCOUNTER — Ambulatory Visit (INDEPENDENT_AMBULATORY_CARE_PROVIDER_SITE_OTHER): Payer: PRIVATE HEALTH INSURANCE | Admitting: Podiatry

## 2021-04-10 ENCOUNTER — Ambulatory Visit (INDEPENDENT_AMBULATORY_CARE_PROVIDER_SITE_OTHER): Payer: PRIVATE HEALTH INSURANCE

## 2021-04-10 DIAGNOSIS — M722 Plantar fascial fibromatosis: Secondary | ICD-10-CM

## 2021-04-10 DIAGNOSIS — Z9889 Other specified postprocedural states: Secondary | ICD-10-CM

## 2021-04-10 DIAGNOSIS — M7751 Other enthesopathy of right foot: Secondary | ICD-10-CM | POA: Diagnosis not present

## 2021-04-10 DIAGNOSIS — M7752 Other enthesopathy of left foot: Secondary | ICD-10-CM | POA: Diagnosis not present

## 2021-04-10 NOTE — Progress Notes (Signed)
  Subjective:  Patient ID: Mackenzie Carr, female    DOB: 12/07/1960,  MRN: [id number]  Chief Complaint  Patient presents with  . Routine Post Op    Doing well. Slight swelling and manageable pain lvls. Denies f/c/n/v.    DOS: 12/21/19 Procedure: ROH, 2nd/3rd Weil Osteotomy and Endoscopic Plantar Fasciotomy left  60 y.o. female presents with the above complaint. History confirmed with patient. States she only has pain ocassionally and only in the ball of the foot. Her heel does not cause her pain.  Objective:  Physical Exam: only mild tenderness at the surgical site, local edema noted and calf supple, nontender. Reduced ROM at the 2nd/3rd MPJs. Incision: well healed.  No images are attached to the encounter. Assessment:   1. Post-operative state   2. Capsulitis of metatarsophalangeal (MTP) joints of both feet   3. Plantar fasciitis    Plan:  Patient was evaluated and treated and all questions answered.  Post-operative State -She is still tight at the MPJs we discussed continued stretching daily -Continue normal shoegear -F/u for discussion of right foot surgery  Return if symptoms worsen or fail to improve.

## 2021-04-24 ENCOUNTER — Other Ambulatory Visit: Payer: Self-pay

## 2021-04-24 ENCOUNTER — Ambulatory Visit (INDEPENDENT_AMBULATORY_CARE_PROVIDER_SITE_OTHER): Payer: PRIVATE HEALTH INSURANCE | Admitting: Podiatry

## 2021-04-24 DIAGNOSIS — M722 Plantar fascial fibromatosis: Secondary | ICD-10-CM

## 2021-04-24 MED ORDER — BETAMETHASONE SOD PHOS & ACET 6 (3-3) MG/ML IJ SUSP
6.0000 mg | Freq: Once | INTRAMUSCULAR | Status: AC
  Administered 2021-04-24: 6 mg

## 2021-04-24 NOTE — Progress Notes (Signed)
  Subjective:  Patient ID: Mackenzie Carr, female    DOB: Sep 29, 1961,  MRN: [id number]  Chief Complaint  Patient presents with  . Foot Problem    Reports knot like sensations at bottom of heel. Excessive pains 15/10. Walks on cement all day at work.    60 y.o. female presents with the above complaint. History confirmed with patient.   Objective:  Physical Exam: warm, good capillary refill, no trophic changes or ulcerative lesions, normal DP and PT pulses and normal sensory exam. Right Foot: POP right medial calc tuber, small punctuate keratosis central heel.    Assessment:   1. Plantar fasciitis    Plan:  Patient was evaluated and treated and all questions answered.  Plantar Fasciitis -Educated patient on stretching and icing of the affected limb -Injection delivered to the plantar fascia of the right foot. -Porokeratosis debrided.  Procedure: Injection Tendon/Ligament Consent: Verbal consent obtained. Location: Right plantar fascia at the glabrous junction; medial approach. Skin Prep: Alcohol. Injectate: 1 cc 0.5% marcaine plain, 1 cc betamethasone acetate-betamethasone sodium phosphate Disposition: Patient tolerated procedure well. Injection site dressed with a band-aid.  Return in about 2 weeks (around 05/08/2021) for Plantar fasciitis, Right.

## 2021-05-08 ENCOUNTER — Ambulatory Visit: Payer: PRIVATE HEALTH INSURANCE | Admitting: Podiatry

## 2021-08-01 ENCOUNTER — Encounter: Payer: Self-pay | Admitting: Neurology

## 2021-09-10 ENCOUNTER — Telehealth: Payer: Self-pay | Admitting: *Deleted

## 2021-09-10 NOTE — Telephone Encounter (Signed)
Patient is calling to request an handicapp placard until upcoming surgery. Please advise.

## 2021-09-11 NOTE — Telephone Encounter (Signed)
Returned called and informed that handicapp placard has been approved and patient can pick up at front desk.she will pick up today.

## 2021-10-16 ENCOUNTER — Other Ambulatory Visit: Payer: Self-pay

## 2021-10-16 ENCOUNTER — Ambulatory Visit (INDEPENDENT_AMBULATORY_CARE_PROVIDER_SITE_OTHER): Payer: PRIVATE HEALTH INSURANCE | Admitting: Podiatry

## 2021-10-16 DIAGNOSIS — M722 Plantar fascial fibromatosis: Secondary | ICD-10-CM | POA: Diagnosis not present

## 2021-10-16 DIAGNOSIS — M7751 Other enthesopathy of right foot: Secondary | ICD-10-CM

## 2021-10-16 DIAGNOSIS — M21961 Unspecified acquired deformity of right lower leg: Secondary | ICD-10-CM

## 2021-10-16 DIAGNOSIS — M7752 Other enthesopathy of left foot: Secondary | ICD-10-CM

## 2021-10-16 NOTE — Progress Notes (Signed)
  Subjective:  Patient ID: Mackenzie Carr, female    DOB: 14-Sep-1961,  MRN: [id number]  No chief complaint on file.  60 y.o. female presents with the above complaint. History confirmed with patient.  States that the left foot is doing very well and she is having no pain or discomfort.  The right foot still continues to hurt her.  She has been experiencing pain in the right foot for greater than a year she would like to focus on the right foot at this time discussed surgical intervention.  Has failed injections bracing shoe gear changes without relief  Objective:  Physical Exam: warm, good capillary refill, no trophic changes or ulcerative lesions, normal DP and PT pulses and normal sensory exam. Left foot: No POP, thin scarring. Right Foot: POP right medial calc tuber, small punctuate keratosis central heel. POP 2nd/3rd MPJ   Assessment:   1. Plantar fasciitis   2. Capsulitis of metatarsophalangeal (MTP) joints of both feet   3. Deformity of metatarsal bone of right foot     Plan:  Patient was evaluated and treated and all questions answered.  Plantar Fasciitis, capsulitis MPJ  -Patient has had persistent pain and would like to discuss surgical intervention.  -Patient has failed conservative therapy and wishes to proceed with surgical intervention. All risks, benefits, and alternatives discussed with patient. No guarantees given. Consent reviewed and signed by patient. -Planned procedures: right foot EPF, 2nd/3rd Met shortening osteotomies as needed.     No follow-ups on file.

## 2021-10-24 ENCOUNTER — Other Ambulatory Visit: Payer: Self-pay | Admitting: Family Medicine

## 2021-10-24 ENCOUNTER — Ambulatory Visit
Admission: RE | Admit: 2021-10-24 | Discharge: 2021-10-24 | Disposition: A | Discharge status: Home / Self Care | Payer: PRIVATE HEALTH INSURANCE | Source: Ambulatory Visit | Attending: Family Medicine | Admitting: Family Medicine

## 2021-10-24 ENCOUNTER — Other Ambulatory Visit: Payer: Self-pay

## 2021-10-24 DIAGNOSIS — R1033 Periumbilical pain: Secondary | ICD-10-CM

## 2021-10-28 NOTE — Progress Notes (Signed)
NEUROLOGY CONSULTATION NOTE  Mackenzie Carr MRN: [id number] DOB: [birth year]  Referring provider: Merri Brunette, MD Primary care provider: Merri Brunette, MD  Reason for consult:  headaches  Assessment/Plan:   Migraine without aura, with status migrainosus, intractable  Migraine prevention:  start topiramate 25mg  at bedtime for one week, then 50mg  at bedtime Migraine rescue:  earliest onset of headache, Nurtec - take one daily up to 7 days or once headache resolved.  Never took it at earliest onset of migraine.  Due to history of TIA, triptans are contraindicated. Limit use of pain relievers to no more than 2 days out of week to prevent risk of rebound or medication-overuse headache. Keep headache diary Follow up 6 months.    Subjective:  Mackenzie Carr is a 60 year old female who presents for headaches.  History supplemented by referring provider's note.  3 episodes this year.  Not for many years Couldn't eat or sleep.  Severe diffuse head pain, nausea, photophobia, phonophobia,  Heating pads on shoulders.  First one lasted 14 days, second 7-10    Onset:  Off and on for many years.  Headache-free for many years.  However she had 3 intractable migraines in 2022. Location:  diffuse Quality:  pressure Intensity:  Severe.  she denies new headache, thunderclap headache or severe headache that wakes her from sleep. Aura:  absent Prodrome:  absent Associated symptoms:  Nausea, photophobia, phonophobia.  She denies associated vomiting, visual disturbance or unilateral numbness or weakness. Duration:  10 days Frequency:  3 in last year (last was 3 months ago) Triggers:  strong smells Relieving factors:  heating pads on shoulders Activity:  aggravates  She has had several head CTs over the past 10 years to evaluate headache.  Last CT head on 01/11/2021 personally reviewed was unremarkable.  She had what was diagnosed as a TIA on 02/03/2010 presenting as syncope  followed by right leg weakness.  She had an MRI/MRA of the head, which was personally reviewed and demonstrated mild chronic small vessel ischemic changes but otherwise unremarkable.  Current NSAIDS/analgesics:  ASA 81mg  daily, acetaminophen, ibuprofen, meloxicam, oxycodone-acetaminophen Current triptans:  none Current ergotamine:  none Current anti-emetic:  Zofran 4mg  Current muscle relaxants:  cylcobenzaprine Current Antihypertensive medications:  amlodipine, lisinopril, HCTZ Current Antidepressant medications:  none Current Anticonvulsant medications:  none Current anti-CGRP:  none Current Vitamins/Herbal/Supplements:  none Current Antihistamines/Decongestants:  Allegra Other therapy:  heat to shoulders Hormone/birth control:  none Other medications:  none  Past NSAIDS/analgesics:  tramadol Past abortive triptans:  sumatriptan tab Past abortive ergotamine:  none Past muscle relaxants:  none Past anti-emetic:  none Past antihypertensive medications:  none Past antidepressant medications:  nortriptyline Past anticonvulsant medications:  none Past anti-CGRP:  Ubrelvy, Nurtec (took in middle of intractable migraine) Past vitamins/Herbal/Supplements:  none Past antihistamines/decongestants:  none Other past therapies:  prednisone   PAST MEDICAL HISTORY: Past Medical History:  Diagnosis Date   GERD (gastroesophageal reflux disease)    Hyperlipidemia    Hypertension    IBS (irritable bowel syndrome)    Migraines    TIA (transient ischemic attack)    MRI 02/03/10 normal. Echo 3/7/11normal EF,normal. Carotid doppler 02/05/10 normal. Cardionet moniitor-no AFIB    PAST SURGICAL HISTORY: Past Surgical History:  Procedure Laterality Date   ABDOMINAL HYSTERECTOMY     bladder tacking     BUNIONECTOMY Bilateral    COLONOSCOPY  09/2011   hyperplastic polyps,diverticula,repeat 10 years-Dr Magod   LAPAROSCOPIC APPENDECTOMY N/A 05/25/2015   Procedure:  APPENDECTOMY LAPAROSCOPIC;   Surgeon: Jackolyn Confer, MD;  Location: [facility];  Service: General;  Laterality: N/A;    MEDICATIONS: Current Outpatient Medications on File Prior to Visit  Medication Sig Dispense Refill   acetaminophen (TYLENOL) 325 MG tablet Take by mouth.     amLODipine (NORVASC) 10 MG tablet Take 10 mg by mouth daily.      aspirin EC 81 MG tablet Take 81 mg by mouth daily.     cephALEXin (KEFLEX) 500 MG capsule Take 1 capsule (500 mg total) by mouth 2 (two) times daily. 14 capsule 0   cyclobenzaprine (FLEXERIL) 10 MG tablet Take 10 mg by mouth 3 (three) times daily as needed. for muscle spams     fexofenadine (ALLEGRA) 180 MG tablet Take 180 mg by mouth daily as needed for allergies.      fluconazole (DIFLUCAN) 150 MG tablet Take 150 mg by mouth daily.     hydrochlorothiazide (HYDRODIURIL) 25 MG tablet Take by mouth.     HYDROcodone-acetaminophen (NORCO/VICODIN) 5-325 MG per tablet Take 1-2 tablets by mouth every 4 (four) hours as needed for moderate pain. 40 tablet 0   ibuprofen (ADVIL,MOTRIN) 200 MG tablet Take 200-400 mg by mouth every 6 (six) hours as needed (For pain.).     influenza vac recom quadrivalent (FLUBLOK QUADRIVALENT) 0.5 ML injection Flublok Quad 2020-2021 (PF) 180 mcg (45 mcg x 4)/0.5 mL IM syringe     lisinopril (PRINIVIL,ZESTRIL) 20 MG tablet Take 20 mg by mouth daily.     meloxicam (MOBIC) 15 MG tablet Take 1 tablet (15 mg total) by mouth daily. 30 tablet 0   methylPREDNISolone (MEDROL DOSEPAK) 4 MG TBPK tablet 6 Day Taper Pack. Take as Directed. 21 tablet 0   mometasone (ELOCON) 0.1 % cream mometasone 0.1 % topical cream  APPLY CREAM TOPICALLY TO AFFECTED AREA ONCE DAILY     nitrofurantoin, macrocrystal-monohydrate, (MACROBID) 100 MG capsule Take 100 mg by mouth every 12 (twelve) hours.     nortriptyline (PAMELOR) 25 MG capsule Take 2 capsules (50 mg total) by mouth at bedtime. 60 capsule 11   ondansetron (ZOFRAN) 4 MG tablet Take 1 tablet (4 mg total) by mouth every 8 (eight) hours  as needed for nausea or vomiting. 20 tablet 0   oxyCODONE-acetaminophen (PERCOCET) 5-325 MG tablet Take 1 tablet by mouth every 4 (four) hours as needed for severe pain. 20 tablet 0   pantoprazole (PROTONIX) 40 MG tablet Take 40 mg by mouth daily.     Potassium Chloride ER 20 MEQ TBCR      potassium chloride SA (KLOR-CON) 20 MEQ tablet potassium chloride ER 20 mEq tablet,extended release  TAKE 1 TABLET BY MOUTH ONCE DAILY FOR 30 DAYS     predniSONE (DELTASONE) 10 MG tablet prednisone 10 mg tablet  TAKE 1 TABLET BY MOUTH THREE TIMES DAILY FOR 2 DAYS AND 1 TWICE DAILY FOR 5 DAYS AND 1 ONCE DAILY UNTIL ALL TAKEN     Rimegepant Sulfate (NURTEC) 75 MG TBDP 1 tablet on the tongue and allow to dissolve     rosuvastatin (CRESTOR) 10 MG tablet Take 10 mg by mouth daily.     SUMAtriptan (IMITREX) 25 MG tablet Take 1 tablet (25 mg total) by mouth every 2 (two) hours as needed for migraine. May repeat in 2 hours if headache persists or recurs. 15 tablet 11   traMADol (ULTRAM) 50 MG tablet tramadol 50 mg tablet     Ubrogepant (UBRELVY) 50 MG TABS 1 tablet may take second  dose at least 2 hours after first dose as needed     No current facility-administered medications on file prior to visit.    ALLERGIES: Allergies  Allergen Reactions   Other Nausea And Vomiting and Other (See Comments)    Dairy products - eggs, cheese, milk    Sulfa Antibiotics Other (See Comments)    THRUSH    FAMILY HISTORY: Family History  Problem Relation Age of Onset   Heart disease Mother        Pacemaker   Diabetes Father    Throat cancer Father    Stroke Maternal Grandfather    Cancer Maternal Grandfather        unsure of type   Cancer Paternal Grandfather        unsure of type   Diabetes Sister    Pancreatic cancer Sister     Objective:  Blood pressure 125/82, pulse 90, height 5\' 2"  (1.575 m), weight 147 lb 3.2 oz (66.8 kg), SpO2 100 %. General: No acute distress.  Patient appears well-groomed.   Head:   Normocephalic/atraumatic Eyes:  fundi examined but not visualized Neck: supple, no paraspinal tenderness, full range of motion Back: No paraspinal tenderness Heart: regular rate and rhythm Lungs: Clear to auscultation bilaterally. Vascular: No carotid bruits. Neurological Exam: Mental status: alert and oriented to person, place, and time, recent and remote memory intact, fund of knowledge intact, attention and concentration intact, speech fluent and not dysarthric, language intact. Cranial nerves: CN I: not tested CN II: pupils equal, round and reactive to light, visual fields intact CN III, IV, VI:  full range of motion, no nystagmus, no ptosis CN V: facial sensation intact. CN VII: upper and lower face symmetric CN VIII: hearing intact CN IX, X: gag intact, uvula midline CN XI: sternocleidomastoid and trapezius muscles intact CN XII: tongue midline Bulk & Tone: normal, no fasciculations. Motor:  muscle strength 5/5 throughout Sensation:  Pinprick, temperature and vibratory sensation intact. Deep Tendon Reflexes:  2+ throughout,  toes downgoing.   Finger to nose testing:  Without dysmetria.   Heel to shin:  Without dysmetria.   Gait:  Normal station and stride.  Romberg negative.    Thank you for allowing me to take part in the care of this patient.  Metta Clines, DO  CC: Carol Ada, MD

## 2021-10-29 ENCOUNTER — Other Ambulatory Visit: Payer: Self-pay

## 2021-10-29 ENCOUNTER — Ambulatory Visit: Payer: PRIVATE HEALTH INSURANCE | Admitting: Neurology

## 2021-10-29 ENCOUNTER — Encounter: Payer: Self-pay | Admitting: Neurology

## 2021-10-29 VITALS — BP 125/82 | HR 90 | Ht 62.0 in | Wt 147.2 lb

## 2021-10-29 DIAGNOSIS — G43011 Migraine without aura, intractable, with status migrainosus: Secondary | ICD-10-CM

## 2021-10-29 MED ORDER — TOPIRAMATE 50 MG PO TABS: ORAL_TABLET | ORAL | 0 refills | Status: DC

## 2021-10-29 NOTE — Patient Instructions (Signed)
  Start topiramate 50mg  tablet - take 1/2 tablet at bedtime for one week, then increase to 1 tablet at bedtime.  When you get a migraine, take Nurtec earliest onset - take 1 daily for up to 7 days as long as you have a migraine.  If effective, contact me for prescription Limit use of pain relievers to no more than 2 days out of the week.  These medications include acetaminophen, NSAIDs (ibuprofen/Advil/Motrin, naproxen/Aleve, triptans (Imitrex/sumatriptan), Excedrin, and narcotics.  This will help reduce risk of rebound headaches. Be aware of common food triggers:  - Caffeine:  coffee, black tea, cola, Mt. Dew  - Chocolate  - Dairy:  aged cheeses (brie, blue, cheddar, gouda, [place], provolone, [place], Swiss, etc), chocolate milk, buttermilk, sour cream, limit eggs and yogurt  - Nuts, peanut butter  - Alcohol  - Cereals/grains:  FRESH breads (fresh bagels, sourdough, doughnuts), yeast productions  - Processed/canned/aged/cured meats (pre-packaged deli meats, hotdogs)  - MSG/glutamate:  soy sauce, flavor enhancer, pickled/preserved/marinated foods  - Sweeteners:  aspartame (Equal, Nutrasweet).  Sugar and Splenda are okay  - Vegetables:  legumes (lima beans, lentils, snow peas, fava beans, pinto peans, peas, garbanzo beans), sauerkraut, onions, olives, pickles  - Fruit:  avocados, bananas, citrus fruit (orange, lemon, grapefruit), mango  - Other:  Frozen meals, macaroni and cheese Routine exercise Stay adequately hydrated (aim for 64 oz water daily) Keep headache diary Maintain proper stress management Maintain proper sleep hygiene Do not skip meals Consider supplements:  magnesium citrate 400mg  daily, riboflavin 400mg  daily, coenzyme Q10 100mg  three times daily.

## 2021-10-29 NOTE — Progress Notes (Signed)
Medication Samples have been provided to the patient.  Drug name: Nurtec       Strength: 75 mg        Qty: 5  LOT: [id number]  Exp.Date: 04/2024  Dosing instructions: As needed  The patient has been instructed regarding the correct time, dose, and frequency of taking this medication, including desired effects and most common side effects.   Leida Lauth 3:13 PM 10/29/2021

## 2021-11-01 ENCOUNTER — Other Ambulatory Visit: Payer: Self-pay | Admitting: Family Medicine

## 2021-11-01 DIAGNOSIS — N289 Disorder of kidney and ureter, unspecified: Secondary | ICD-10-CM

## 2021-11-22 ENCOUNTER — Telehealth: Payer: Self-pay | Admitting: Urology

## 2021-11-22 ENCOUNTER — Other Ambulatory Visit: Payer: Self-pay

## 2021-11-22 ENCOUNTER — Ambulatory Visit
Admission: RE | Admit: 2021-11-22 | Discharge: 2021-11-22 | Disposition: A | Discharge status: Home / Self Care | Payer: PRIVATE HEALTH INSURANCE | Source: Ambulatory Visit | Attending: Family Medicine | Admitting: Family Medicine

## 2021-11-22 DIAGNOSIS — N289 Disorder of kidney and ureter, unspecified: Secondary | ICD-10-CM

## 2021-11-22 IMAGING — MR MR ABDOMEN WO/W CM
17 series · 48 of 48 positions shown · IV contrast (13ml multihance)
Comparison: Noncontrast CT on [DATE]

CLINICAL DATA: Indeterminate right renal lesion recent noncontrast
CT.

EXAM:
MRI ABDOMEN WITHOUT AND WITH CONTRAST
TECHNIQUE: Multiplanar multisequence MR imaging of the abdomen was performed
both before and after the administration of intravenous contrast.
CONTRAST:  13mL MULTIHANCE GADOBENATE DIMEGLUMINE 529 MG/ML IV SOLN

[Series 3: T2 · coronal · 5.0mm · 1.56mm/px · 1 of 36 slices shown (1 of 3)]
[im 1/36]
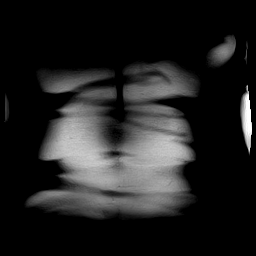

[Series 4: T1 · axial · 3.0mm · 1.19mm/px · z∈[-81,+132]mm · 5 of 144 slices shown]
[im 1/144]
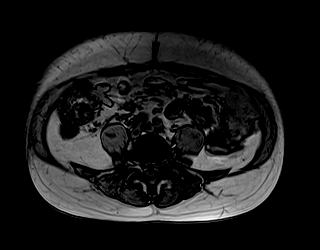
[im 36/144]
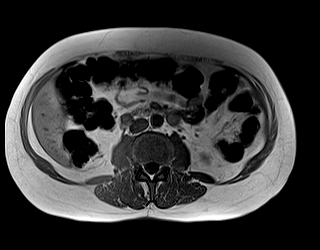
[im 72/144]
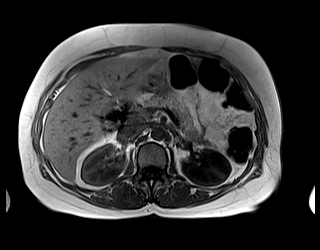
[im 108/144]
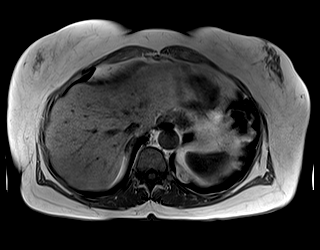
[im 144/144]
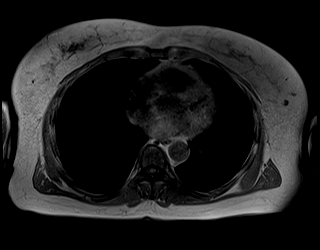

[Series 5: bSSFP · axial · 5.0mm · 1.25mm/px · z∈[-85,+137]mm · 2 of 38 slices shown]
[im 1/38]
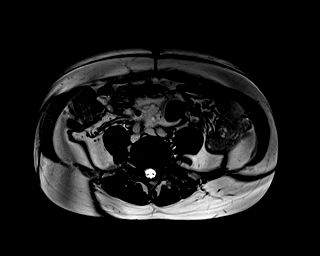
[im 38/38]
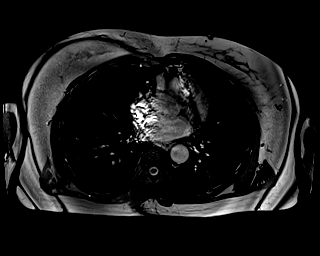

[Series 6: T2 · axial · 5.0mm · 1.48mm/px · z∈[-62,+160]mm · 2 of 38 slices shown (2 of 3)]
[im 1/38]
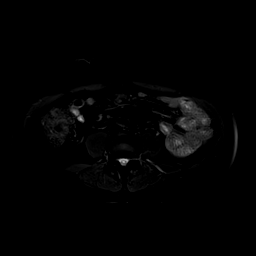
[im 38/38]
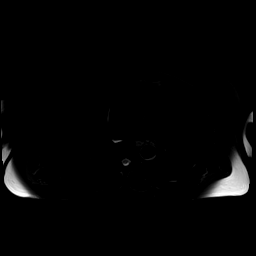

[Series 7: DWI · axial · 5.0mm · 1.42mm/px · z∈[-43,+167]mm · 5 of 108 slices shown (1 of 2)]
[im 1/108]
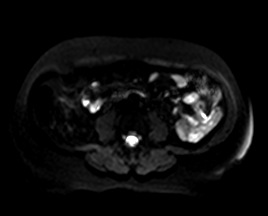
[im 27/108]
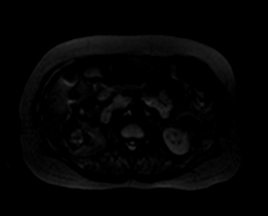
[im 54/108]
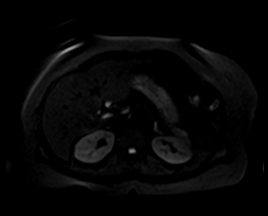
[im 81/108]
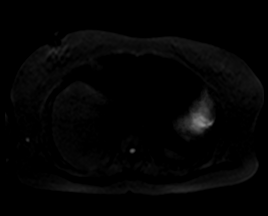
[im 108/108]
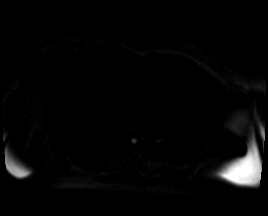

[Series 8: DWI · axial · 5.0mm · 1.42mm/px · z∈[-43,+167]mm · 2 of 36 slices shown (2 of 2)]
[im 1/36]
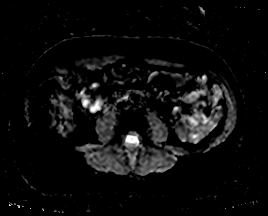
[im 36/36]
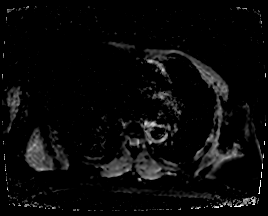

[Series 9: T2 · axial · 6.0mm · 1.19mm/px · 1 of 30 slices shown (3 of 3)]
[im 1/30]
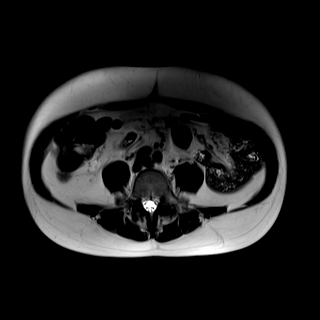

[Series 10: T1 dynamic · axial · non-contrast · 3.0mm · 1.25mm/px · z∈[-81,+132]mm · 3 of 72 slices shown]
[im 1/72]
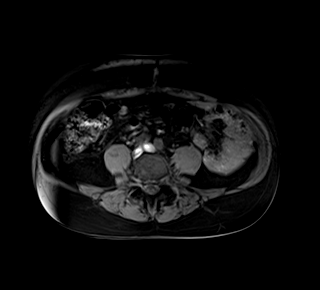
[im 36/72]
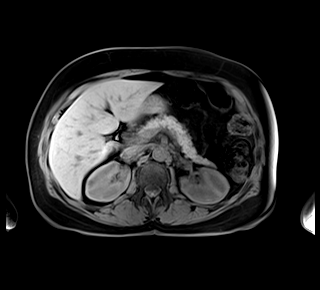
[im 72/72]
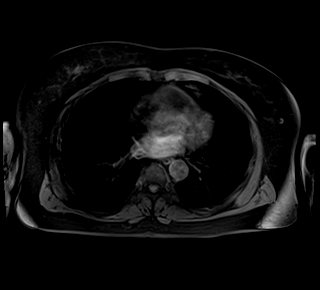

[Series 11: T1 dynamic post-contrast · axial · 3.0mm · 1.25mm/px · z∈[-81,+132]mm · 3 of 72 slices shown (1 of 9)]
[im 1/72]
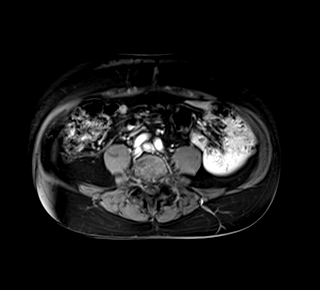
[im 36/72]
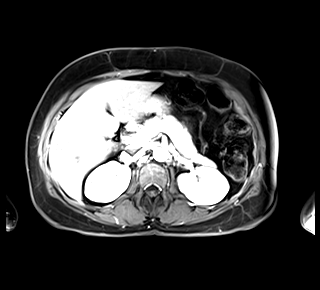
[im 72/72]
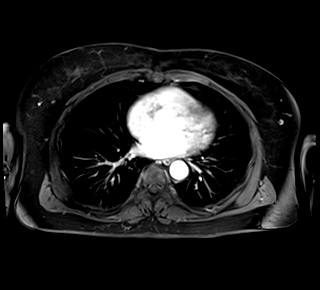

[Series 12: T1 dynamic post-contrast · axial · 3.0mm · 1.25mm/px · z∈[-81,+132]mm · 3 of 72 slices shown (2 of 9)]
[im 1/72]
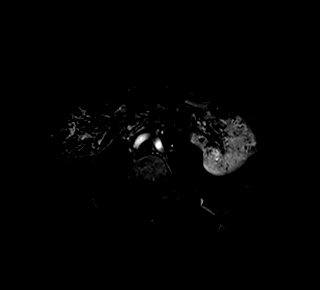
[im 36/72]
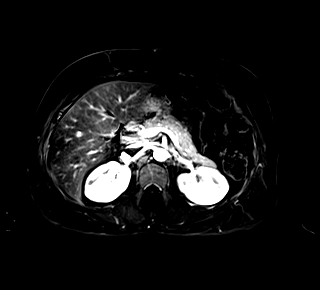
[im 72/72]
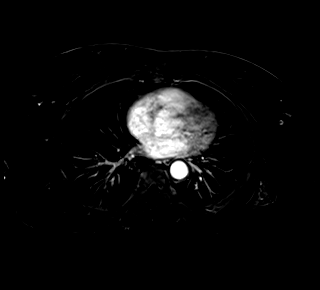

[Series 13: T1 dynamic post-contrast · axial · 3.0mm · 1.25mm/px · z∈[-81,+132]mm · 3 of 72 slices shown (3 of 9)]
[im 1/72]
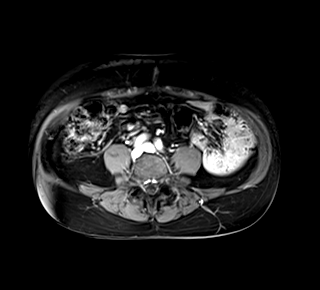
[im 36/72]
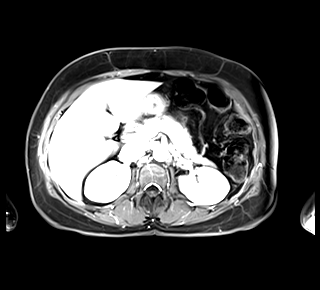
[im 72/72]
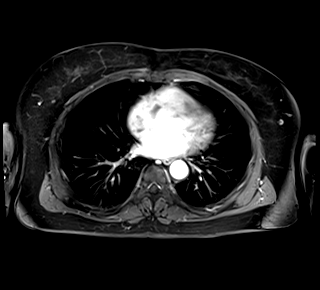

[Series 14: T1 dynamic post-contrast · axial · 3.0mm · 1.25mm/px · z∈[-81,+132]mm · 3 of 72 slices shown (4 of 9)]
[im 1/72]
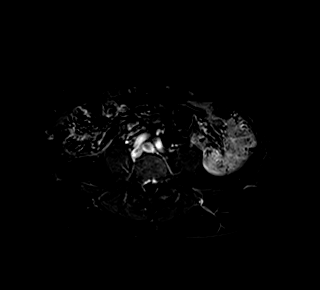
[im 36/72]
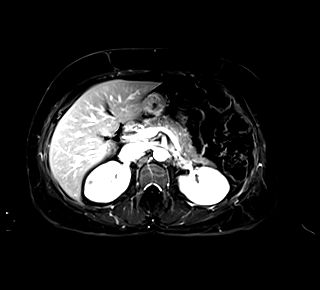
[im 72/72]
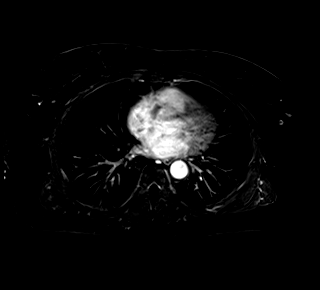

[Series 15: T1 dynamic post-contrast · axial · 3.0mm · 1.25mm/px · z∈[-81,+132]mm · 3 of 72 slices shown (5 of 9)]
[im 1/72]
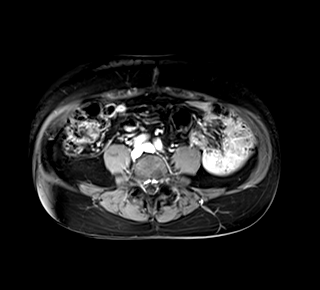
[im 36/72]
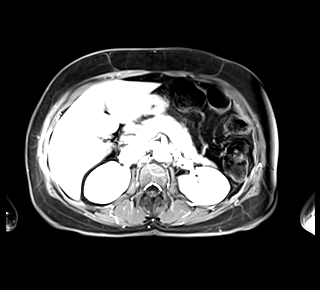
[im 72/72]
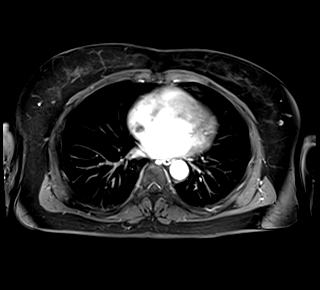

[Series 16: T1 dynamic post-contrast · axial · 3.0mm · 1.25mm/px · z∈[-81,+132]mm · 3 of 72 slices shown (6 of 9)]
[im 1/72]
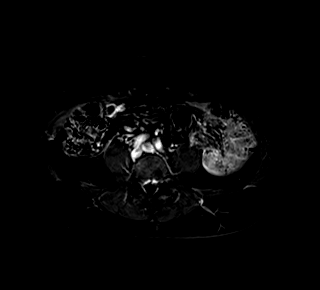
[im 36/72]
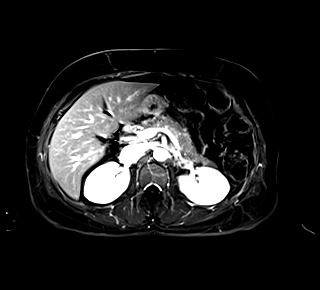
[im 72/72]
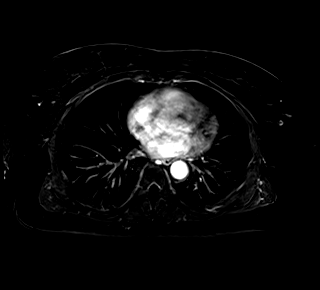

[Series 17: T1 dynamic post-contrast · coronal · 3.0mm · 1.25mm/px · 3 of 72 slices shown (7 of 9)]
[im 1/72]
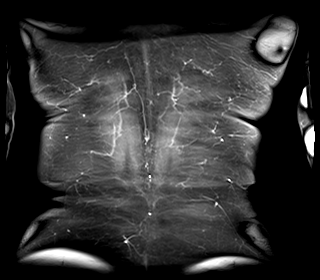
[im 36/72]
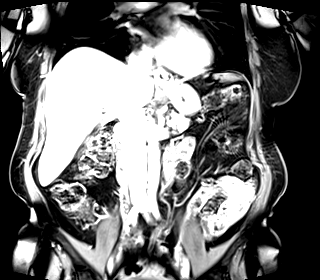
[im 72/72]
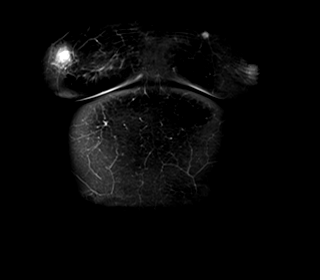

[Series 18: T1 dynamic post-contrast · axial · 3.0mm · 1.25mm/px · z∈[-81,+132]mm · 3 of 72 slices shown (8 of 9)]
[im 1/72]
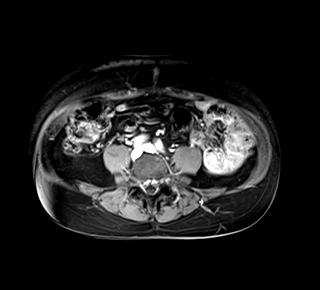
[im 36/72]
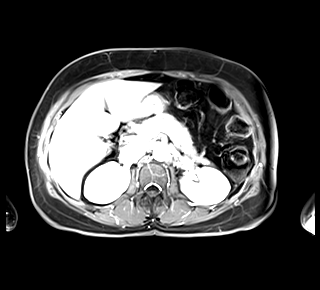
[im 72/72]
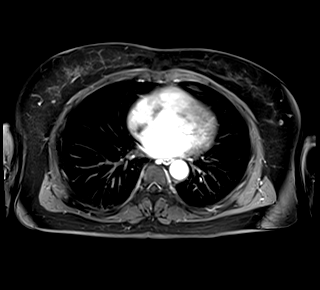

[Series 19: T1 dynamic post-contrast · axial · 3.0mm · 1.25mm/px · z∈[-81,+132]mm · 3 of 72 slices shown (9 of 9)]
[im 1/72]
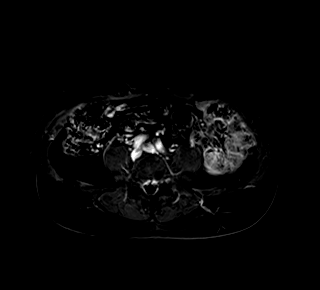
[im 36/72]
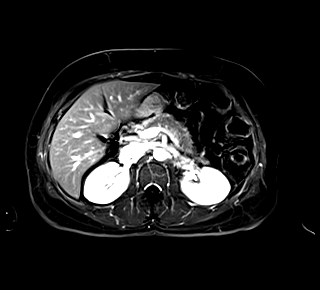
[im 72/72]
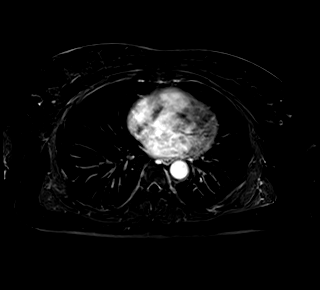

[48 of 48 positions shown; findings below may reference images not displayed]

FINDINGS: Lower chest: No acute findings.

Hepatobiliary: No hepatic masses identified. Tiny sub-cm cyst is
seen in the lateral liver dome. Gallbladder is unremarkable. No
evidence of biliary ductal dilatation.

Pancreas: Several tiny cystic lesions are seen in the pancreatic
head and uncinate process, largest measuring 10 x 7 mm on image
[DATE]. A few other tiny less than 5 mm cystic foci are seen in the
pancreatic body and tail. No evidence of main pancreatic ductal
dilatation or pancreas divisum.

Spleen:  Within normal limits in size and appearance.

Adrenals/Urinary Tract: 2 tiny subcapsular simple cysts are seen in
the upper pole of the right kidney, corresponding with the lesion
seen on recent CT. A few tiny sub-cm left renal cysts are also
noted. No masses identified. No evidence of hydronephrosis.

Stomach/Bowel: Visualized portion unremarkable.

Vascular/Lymphatic: No pathologically enlarged lymph nodes
identified. No acute vascular findings.

Other:  None.

Musculoskeletal:  No suspicious bone lesions identified.
IMPRESSION: Tiny benign right renal cyst, corresponding with the lesion seen on
prior CT. No evidence of renal neoplasm.

Several tiny cystic lesions in the pancreas, largest measuring 10 mm
in the uncinate process. These likely represent indolent cystic
neoplasms, such as side-branch IPMNs. Recommend continued follow-up
by MRI in 1 year. This recommendation follows ACR consensus
guidelines: Management of Incidental Pancreatic Cysts: A White Paper
of the ACR Incidental Findings Committee. [HOSPITAL]

## 2021-11-22 MED ORDER — GADOBENATE DIMEGLUMINE 529 MG/ML IV SOLN
13.0000 mL | Freq: Once | INTRAVENOUS | Status: AC | PRN
  Administered 2021-11-22: 18:00:00 13 mL via INTRAVENOUS

## 2021-11-22 NOTE — Telephone Encounter (Signed)
DOS - 12/19/21  EPF RIGHT --- 81856  [facility] EFFECTIVE DATE - 12/02/21   SPOKE WITH GINA WITH [facility] AND SHE STATED THAT FOR CPT CODE 31497 NO PRIOR AUTH IS REQUIRED.   REF # [id number]

## 2021-12-19 ENCOUNTER — Other Ambulatory Visit: Payer: Self-pay | Admitting: Podiatry

## 2021-12-19 DIAGNOSIS — M21541 Acquired clubfoot, right foot: Secondary | ICD-10-CM

## 2021-12-19 DIAGNOSIS — M722 Plantar fascial fibromatosis: Secondary | ICD-10-CM

## 2021-12-19 MED ORDER — ONDANSETRON HCL 4 MG PO TABS: 4.0000 mg | ORAL_TABLET | Freq: Three times a day (TID) | ORAL | 0 refills | Status: DC | PRN

## 2021-12-19 MED ORDER — CEPHALEXIN 500 MG PO CAPS: 500.0000 mg | ORAL_CAPSULE | Freq: Two times a day (BID) | ORAL | 0 refills | Status: DC

## 2021-12-19 MED ORDER — OXYCODONE-ACETAMINOPHEN 5-325 MG PO TABS: 1.0000 | ORAL_TABLET | ORAL | 0 refills | Status: DC | PRN

## 2021-12-19 NOTE — Progress Notes (Signed)
Rx sent to pharmacy for outpatient surgery. °

## 2021-12-25 ENCOUNTER — Ambulatory Visit (INDEPENDENT_AMBULATORY_CARE_PROVIDER_SITE_OTHER): Payer: PRIVATE HEALTH INSURANCE | Admitting: Podiatry

## 2021-12-25 ENCOUNTER — Other Ambulatory Visit: Payer: Self-pay

## 2021-12-25 ENCOUNTER — Ambulatory Visit (INDEPENDENT_AMBULATORY_CARE_PROVIDER_SITE_OTHER): Payer: PRIVATE HEALTH INSURANCE

## 2021-12-25 DIAGNOSIS — Z9889 Other specified postprocedural states: Secondary | ICD-10-CM | POA: Diagnosis not present

## 2021-12-25 DIAGNOSIS — R600 Localized edema: Secondary | ICD-10-CM

## 2021-12-25 MED ORDER — OXYCODONE-ACETAMINOPHEN 5-325 MG PO TABS: 1.0000 | ORAL_TABLET | ORAL | 0 refills | Status: DC | PRN

## 2021-12-25 NOTE — Progress Notes (Signed)
°  Subjective:  Patient ID: Mackenzie Carr, female    DOB: Sep 04, 1961,  MRN: [id number]  Chief Complaint  Patient presents with   Follow-up   Routine Post Op    POV #1 DOS 12/19/2021 EPF RT    DOS: 12/19/21 Procedure: 2nd/3rd Gerhard Munch Osteotomy and Endoscopic Plantar Fasciotomy right  61 y.o. female presents with the above complaint. History confirmed with patient.  Request refill of her pain medication today.  Pain rated as 6 out of 10 today.  Denies nausea vomiting fever chills chest pain and shortness of breath.  Does endorse pain to the right calf Objective:  Physical Exam: tenderness at the surgical site and local edema noted.  Mild calf tenderness without erythema Incision: healing well, no significant drainage, no dehiscence, no significant erythema  No images are attached to the encounter.  Radiographs: X-ray of the right foot: consistent with post-op state, with good alignment and no evidence of hardware complication  Assessment:   1. Status post right foot surgery   2. Localized edema     Plan:  Patient was evaluated and treated and all questions answered.  Post-operative State -XR reviewed with patient -Dressing applied consisting of sterile gauze, kerlix, and ACE bandage -WBAT in CAM boot -Pain medication refilled -Given edema and pain to the right calf will order DVT ultrasound to rule out DVT -XRs needed at follow-up: none   Return in about 2 weeks (around 01/08/2022) for Post-Op (No XRs).

## 2021-12-27 ENCOUNTER — Telehealth: Payer: Self-pay | Admitting: Podiatry

## 2021-12-27 ENCOUNTER — Encounter (HOSPITAL_COMMUNITY): Payer: PRIVATE HEALTH INSURANCE

## 2021-12-27 ENCOUNTER — Other Ambulatory Visit: Payer: Self-pay

## 2021-12-27 ENCOUNTER — Ambulatory Visit (HOSPITAL_COMMUNITY)
Admission: RE | Admit: 2021-12-27 | Discharge: 2021-12-27 | Disposition: A | Discharge status: Home / Self Care | Payer: PRIVATE HEALTH INSURANCE | Source: Ambulatory Visit | Attending: Podiatry | Admitting: Podiatry

## 2021-12-27 DIAGNOSIS — R600 Localized edema: Secondary | ICD-10-CM | POA: Insufficient documentation

## 2021-12-27 NOTE — Telephone Encounter (Signed)
Helene [facility] is calling with preliminary results. Positive for blood clot in a vein in the calf muscle. You can reach her at [phone] she is awaiting further instructions.

## 2021-12-27 NOTE — Telephone Encounter (Signed)
Called and spoke to lab advised she is ok for discharge with PCP f/u ASAP. If she cannot get an appt with PCP in a reasonable time span patient to advise [place]

## 2021-12-31 ENCOUNTER — Encounter (HOSPITAL_COMMUNITY): Payer: PRIVATE HEALTH INSURANCE

## 2022-01-08 ENCOUNTER — Other Ambulatory Visit: Payer: Self-pay

## 2022-01-08 ENCOUNTER — Ambulatory Visit (INDEPENDENT_AMBULATORY_CARE_PROVIDER_SITE_OTHER): Payer: PRIVATE HEALTH INSURANCE | Admitting: Podiatry

## 2022-01-08 DIAGNOSIS — R600 Localized edema: Secondary | ICD-10-CM

## 2022-01-08 DIAGNOSIS — Z9889 Other specified postprocedural states: Secondary | ICD-10-CM

## 2022-01-08 MED ORDER — OXYCODONE-ACETAMINOPHEN 5-325 MG PO TABS: 1.0000 | ORAL_TABLET | ORAL | 0 refills | Status: AC | PRN

## 2022-01-08 NOTE — Progress Notes (Signed)
°  Subjective:  Patient ID: Mackenzie Carr, female    DOB: 1961-11-23,  MRN: [id number]  Chief Complaint  Patient presents with   Routine Post Op     POV #1 DOS 01/02/2022 RT GREAT TOE HAMMERTOE CORRECTION W/ IPJ ARTHROPLASTY   DOS: 12/19/21 Procedure: 2nd/3rd Gerhard Munch Osteotomy and Endoscopic Plantar Fasciotomy right  61 y.o. female presents with the above complaint. History confirmed with patient.  Calf is doing much better is using heat packs on it, feels pain is improving. Objective:  Physical Exam: tenderness at the surgical site and local edema noted.  No calf tenderness, no edema or erythema Incision: healing well, no significant drainage, no dehiscence, no significant erythema  No images are attached to the encounter.  Radiographs: X-ray of the right foot: consistent with post-op state, with good alignment and no evidence of hardware complication  Assessment:   1. Localized edema   2. Status post right foot surgery   3. Post-operative state    Plan:  Patient was evaluated and treated and all questions answered.  Post-operative State -Sutures removed -Staples removed -Ok to start showering at this time. Advised they cannot soak. -WBAT in CAM boot -Pain medication refilled -XRs needed at follow-up: none  Calf vein thrombosis -Improving with heat pack -No CP or SOB. Advised to present to ED promptly should this occur   No follow-ups on file.

## 2022-01-25 ENCOUNTER — Ambulatory Visit (INDEPENDENT_AMBULATORY_CARE_PROVIDER_SITE_OTHER): Payer: PRIVATE HEALTH INSURANCE

## 2022-01-25 ENCOUNTER — Ambulatory Visit (INDEPENDENT_AMBULATORY_CARE_PROVIDER_SITE_OTHER): Payer: PRIVATE HEALTH INSURANCE | Admitting: Podiatry

## 2022-01-25 ENCOUNTER — Other Ambulatory Visit: Payer: Self-pay

## 2022-01-25 DIAGNOSIS — Z9889 Other specified postprocedural states: Secondary | ICD-10-CM

## 2022-01-30 ENCOUNTER — Telehealth: Payer: Self-pay | Admitting: Podiatry

## 2022-01-31 NOTE — Progress Notes (Signed)
°  Subjective:  Patient ID: Mackenzie Carr, female    DOB: 1961/06/27,  MRN: [id number]  Chief Complaint  Patient presents with   Routine Post Op    POV #2 DOS 12/19/2021 EPF RT   DOS: 12/19/21 Procedure: 2nd/3rd Malka So Osteotomy and Endoscopic Plantar Fasciotomy right  61 y.o. female presents with the above complaint. History confirmed with patient.  No calf pain/DVT.  Some pain in the foot/discomfort.  Patient has not been taking gabapentin Objective:  Physical Exam: tenderness at the surgical site and local edema noted.  No calf tenderness, no edema or erythema Incision: healing well, no significant drainage, no dehiscence, no significant erythema  No images are attached to the encounter.  Radiographs: X-ray of the right foot: consistent with post-op state, with good alignment and no evidence of hardware complication  Assessment:   1. Status post right foot surgery    Plan:  Patient was evaluated and treated and all questions answered.  Post-operative State -Sutures removed -Staples removed -Ok to start showering at this time. Advised they cannot soak. Weightbearing as tolerated in regular shoes. -XRs needed at follow-up: none -Gabapentin was dispensed to patient's pharmacy  Calf vein thrombosis -Improving with heat pack -No CP or SOB. Advised to present to ED promptly should this occur   No follow-ups on file.

## 2022-02-22 ENCOUNTER — Other Ambulatory Visit: Payer: Self-pay

## 2022-02-22 ENCOUNTER — Ambulatory Visit (INDEPENDENT_AMBULATORY_CARE_PROVIDER_SITE_OTHER): Payer: PRIVATE HEALTH INSURANCE | Admitting: Podiatry

## 2022-02-22 DIAGNOSIS — Z9889 Other specified postprocedural states: Secondary | ICD-10-CM

## 2022-02-26 NOTE — Progress Notes (Signed)
?  Subjective:  ?Patient ID: Mackenzie Carr, female    DOB: June 04, 1961,  MRN: [id number] ? ?Chief Complaint  ?Patient presents with  ? Routine Post Op  ?  POV #3 DOS 12/19/2021 EPF RT  ? ?DOS: 12/19/21 ?Procedure: 2nd/3rd Weil Osteotomy and Endoscopic Plantar Fasciotomy right ? ?61 y.o. female presents with the above complaint. History confirmed with patient.  No calf pain/DVT.  She states she is doing okay.  Her pain is manageable. ?Objective:  ?Physical Exam: tenderness at the surgical site and local edema noted.  No calf tenderness, no edema or erythema ?Incision: healing well, no significant drainage, no dehiscence, no significant erythema ? ?No images are attached to the encounter. ? ?Radiographs: ?X-ray of the right foot: consistent with post-op state, with good alignment and no evidence of hardware complication ? ?Assessment:  ? ?1. Status post right foot surgery   ? ? ?Plan:  ?Patient was evaluated and treated and all questions answered. ? ?Post-operative State ?-Clinically gabapentin helps with some of her pain.  She still has some soreness likely due to tight underlying scar contracture.  I discussed with her she can begin return to regular shoes.  She will slowly begin the transition.   ? ?Calf vein thrombosis ?-Improving with heat pack ?-No CP or SOB. Advised to present to ED promptly should this occur ?  ?No follow-ups on file.  ? ?

## 2022-02-27 ENCOUNTER — Telehealth: Payer: Self-pay | Admitting: Podiatry

## 2022-02-27 NOTE — Telephone Encounter (Signed)
I spoke with patient and she wants to have her leave extended at least for another 4 weeks. She works in a [employer], [employer] and she has to move pallents. Please advise? ?

## 2022-03-19 ENCOUNTER — Other Ambulatory Visit: Payer: Self-pay | Admitting: Gastroenterology

## 2022-03-22 ENCOUNTER — Ambulatory Visit (INDEPENDENT_AMBULATORY_CARE_PROVIDER_SITE_OTHER): Payer: PRIVATE HEALTH INSURANCE | Admitting: Podiatry

## 2022-03-22 DIAGNOSIS — M722 Plantar fascial fibromatosis: Secondary | ICD-10-CM | POA: Diagnosis not present

## 2022-03-26 ENCOUNTER — Telehealth: Payer: Self-pay | Admitting: Podiatry

## 2022-03-26 NOTE — Telephone Encounter (Signed)
Please include weight bearing status/ treatment plan and restrictions in notes, per disability. Thank you ?

## 2022-03-26 NOTE — Telephone Encounter (Signed)
I am holding for notes for patient last office visit on 03/22/2022 for her disability. She is scheduled to return to work 04/16/2022, how long should her leave be extended? ?

## 2022-03-27 ENCOUNTER — Other Ambulatory Visit: Payer: Self-pay | Admitting: Obstetrics and Gynecology

## 2022-03-27 DIAGNOSIS — R928 Other abnormal and inconclusive findings on diagnostic imaging of breast: Secondary | ICD-10-CM

## 2022-03-27 NOTE — Progress Notes (Signed)
?  Subjective:  ?Patient ID: Mackenzie Carr, female    DOB: Jul 12, 1961,  MRN: [id number] ? ?Chief Complaint  ?Patient presents with  ? Routine Post Op  ?  POV #4 DOS 12/19/2021 EPF RT  ? ?DOS: 12/19/21 ?Procedure: 2nd/3rd Weil Osteotomy and Endoscopic Plantar Fasciotomy right ? ?61 y.o. female presents with the above complaint. History confirmed with patient.  No calf pain/DVT.  She states she is doing okay.  Her pain is manageable. ?Objective:  ?Physical Exam: tenderness at the surgical site and local edema noted.  No calf tenderness, no edema or erythema ?Incision: healing well, no significant drainage, no dehiscence, no significant erythema ? ?No images are attached to the encounter. ? ?Radiographs: ?X-ray of the right foot: consistent with post-op state, with good alignment and no evidence of hardware complication ? ?Assessment:  ? ?1. Plantar fasciitis   ? ? ? ?Plan:  ?Patient was evaluated and treated and all questions answered. ? ?Post-operative State ?-Clinically gabapentin helps with some of her pain.  She still has some soreness likely due to tight underlying scar contracture.   ?-She can begin transition to regular shoes weightbearing as tolerated.  She can slowly start with light duty and began to work gradually as she is able to to maximal capacity. ?-She will need 4 more weeks of disability to help with this transition. ? ?Calf vein thrombosis ?-Improving with heat pack ?-No CP or SOB. Advised to present to ED promptly should this occur ?  ?No follow-ups on file.  ? ?

## 2022-04-01 ENCOUNTER — Ambulatory Visit
Admission: RE | Admit: 2022-04-01 | Discharge: 2022-04-01 | Disposition: A | Discharge status: Home / Self Care | Payer: PRIVATE HEALTH INSURANCE | Source: Ambulatory Visit | Attending: Obstetrics and Gynecology | Admitting: Obstetrics and Gynecology

## 2022-04-01 DIAGNOSIS — R928 Other abnormal and inconclusive findings on diagnostic imaging of breast: Secondary | ICD-10-CM

## 2022-05-01 ENCOUNTER — Ambulatory Visit (INDEPENDENT_AMBULATORY_CARE_PROVIDER_SITE_OTHER): Payer: PRIVATE HEALTH INSURANCE | Admitting: Podiatry

## 2022-05-01 ENCOUNTER — Encounter: Payer: Self-pay | Admitting: Podiatry

## 2022-05-01 DIAGNOSIS — M722 Plantar fascial fibromatosis: Secondary | ICD-10-CM | POA: Diagnosis not present

## 2022-05-01 MED ORDER — GABAPENTIN 300 MG PO CAPS
300.0000 mg | ORAL_CAPSULE | Freq: Three times a day (TID) | ORAL | 3 refills | Status: AC
Start: 2022-05-01 — End: ?

## 2022-05-01 NOTE — Telephone Encounter (Signed)
error 

## 2022-05-03 ENCOUNTER — Encounter: Payer: PRIVATE HEALTH INSURANCE | Admitting: Podiatry

## 2022-05-07 NOTE — Progress Notes (Signed)
  Subjective:  Patient ID: Mackenzie Carr, female    DOB: 12-22-1960,  MRN: [id number]  Chief Complaint  Patient presents with   Routine Post Op    POV #4 DOS 12/19/2021 EPF RT   DOS: 12/19/21 Procedure: 2nd/3rd Gerhard Munch Osteotomy and Endoscopic Plantar Fasciotomy right  61 y.o. female presents with the above complaint. History confirmed with patient.  No calf pain/DVT.  She states she is doing okay.  She would like to return back to work full-time if possible. Objective:  Physical Exam: tenderness at the surgical site and local edema noted.  No calf tenderness, no edema or erythema Incision: healing well, no significant drainage, no dehiscence, no significant erythema  No images are attached to the encounter.  Radiographs: X-ray of the right foot: consistent with post-op state, with good alignment and no evidence of hardware complication  Assessment:   1. Plantar fasciitis       Plan:  Patient was evaluated and treated and all questions answered.  Post-operative State -Clinically gabapentin helps with some of her pain.  She still has some soreness likely due to tight underlying scar contracture.   -She can begin transition to regular shoes weightbearing as tolerated.   -She can return to full duty slowly.  She can take some time off to sit down if it becomes too aggravated.  Otherwise patient is officially discharged from my care.  If any other foot and ankle issues arise in the future of asked her to come back and see me.  Calf vein thrombosis -Improving with heat pack -No CP or SOB. Advised to present to ED promptly should this occur   No follow-ups on file.

## 2022-06-17 ENCOUNTER — Ambulatory Visit: Payer: PRIVATE HEALTH INSURANCE | Admitting: Neurology

## 2022-06-26 ENCOUNTER — Ambulatory Visit (INDEPENDENT_AMBULATORY_CARE_PROVIDER_SITE_OTHER): Payer: PRIVATE HEALTH INSURANCE | Admitting: Podiatry

## 2022-06-26 DIAGNOSIS — G5761 Lesion of plantar nerve, right lower limb: Secondary | ICD-10-CM | POA: Diagnosis not present

## 2022-06-26 DIAGNOSIS — M7751 Other enthesopathy of right foot: Secondary | ICD-10-CM

## 2022-06-26 DIAGNOSIS — M7752 Other enthesopathy of left foot: Secondary | ICD-10-CM | POA: Diagnosis not present

## 2022-07-02 NOTE — Progress Notes (Signed)
Subjective:  Patient ID: Mackenzie Carr, female    DOB: Mar 29, 1961,  MRN: [id number]  Chief Complaint  Patient presents with   Routine Post Op    61 y.o. female presents with the above complaint.  Patient presents with right second metatarsophalangeal joint and forefoot metatarsalgia pain.  Patient states that she has started to do with it.  She had the surgery done by Dr. Samuella Cota where she underwent the following procedure  DOS: 12/19/21 Procedure: 2nd/3rd Weil Osteotomy and Endoscopic Plantar Fasciotomy right  She states that it still hurts to ambulate.  She has not tried a steroid injection.  She would like to discuss further treatment options for this.  She denies any other acute complaints.    Review of Systems: Negative except as noted in the HPI. Denies N/V/F/Ch.  Past Medical History:  Diagnosis Date   GERD (gastroesophageal reflux disease)    Hyperlipidemia    Hypertension    IBS (irritable bowel syndrome)    Migraines    TIA (transient ischemic attack)    MRI 02/03/10 normal. Echo 3/7/11normal EF,normal. Carotid doppler 02/05/10 normal. Cardionet moniitor-no AFIB    Current Outpatient Medications:    acetaminophen (TYLENOL) 325 MG tablet, Take by mouth., Disp: , Rfl:    amLODipine (NORVASC) 10 MG tablet, Take 10 mg by mouth daily. , Disp: , Rfl:    aspirin EC 81 MG tablet, Take 81 mg by mouth daily., Disp: , Rfl:    cephALEXin (KEFLEX) 500 MG capsule, Take 1 capsule (500 mg total) by mouth 2 (two) times daily., Disp: 14 capsule, Rfl: 0   cyclobenzaprine (FLEXERIL) 10 MG tablet, Take 10 mg by mouth 3 (three) times daily as needed. for muscle spams, Disp: , Rfl:    cyclobenzaprine (FLEXERIL) 5 MG tablet, 1 tablet, Disp: , Rfl:    fexofenadine (ALLEGRA) 180 MG tablet, Take 180 mg by mouth daily as needed for allergies. , Disp: , Rfl:    fluconazole (DIFLUCAN) 150 MG tablet, Take 150 mg by mouth daily. (Patient not taking: Reported on 10/29/2021), Disp: , Rfl:     gabapentin (NEURONTIN) 300 MG capsule, Take 1 capsule (300 mg total) by mouth 3 (three) times daily., Disp: 90 capsule, Rfl: 3   hydrochlorothiazide (HYDRODIURIL) 25 MG tablet, Take by mouth., Disp: , Rfl:    ibuprofen (ADVIL) 800 MG tablet, 1 tablet with food or milk as needed, Disp: , Rfl:    ibuprofen (ADVIL,MOTRIN) 200 MG tablet, Take 200-400 mg by mouth every 6 (six) hours as needed (For pain.)., Disp: , Rfl:    influenza vac recom quadrivalent (FLUBLOK QUADRIVALENT) 0.5 ML injection, Flublok Quad 2020-2021 (PF) 180 mcg (45 mcg x 4)/0.5 mL IM syringe, Disp: , Rfl:    lisinopril (PRINIVIL,ZESTRIL) 20 MG tablet, Take 20 mg by mouth daily., Disp: , Rfl:    meloxicam (MOBIC) 15 MG tablet, Take 1 tablet (15 mg total) by mouth daily., Disp: 30 tablet, Rfl: 0   meloxicam (MOBIC) 15 MG tablet, Take 1 tablet by mouth daily., Disp: , Rfl:    methylPREDNISolone (MEDROL DOSEPAK) 4 MG TBPK tablet, 6 Day Taper Pack. Take as Directed. (Patient not taking: Reported on 10/29/2021), Disp: 21 tablet, Rfl: 0   mometasone (ELOCON) 0.1 % cream, mometasone 0.1 % topical cream  APPLY CREAM TOPICALLY TO AFFECTED AREA ONCE DAILY, Disp: , Rfl:    nitrofurantoin, macrocrystal-monohydrate, (MACROBID) 100 MG capsule, Take 100 mg by mouth every 12 (twelve) hours., Disp: , Rfl:    nortriptyline (PAMELOR) 25  MG capsule, Take 2 capsules (50 mg total) by mouth at bedtime. (Patient not taking: Reported on 10/29/2021), Disp: 60 capsule, Rfl: 11   ondansetron (ZOFRAN) 4 MG tablet, Take 1 tablet (4 mg total) by mouth every 8 (eight) hours as needed for nausea or vomiting., Disp: 20 tablet, Rfl: 0   oxyCODONE-acetaminophen (PERCOCET) 5-325 MG tablet, Take 1 tablet by mouth every 4 (four) hours as needed for severe pain., Disp: 20 tablet, Rfl: 0   pantoprazole (PROTONIX) 40 MG tablet, Take 40 mg by mouth daily., Disp: , Rfl:    Potassium Chloride ER 20 MEQ TBCR, , Disp: , Rfl:    potassium chloride SA (KLOR-CON) 20 MEQ tablet,  potassium chloride ER 20 mEq tablet,extended release  TAKE 1 TABLET BY MOUTH ONCE DAILY FOR 30 DAYS, Disp: , Rfl:    predniSONE (DELTASONE) 10 MG tablet, prednisone 10 mg tablet  TAKE 1 TABLET BY MOUTH THREE TIMES DAILY FOR 2 DAYS AND 1 TWICE DAILY FOR 5 DAYS AND 1 ONCE DAILY UNTIL ALL TAKEN (Patient not taking: Reported on 10/29/2021), Disp: , Rfl:    Rimegepant Sulfate (NURTEC) 75 MG TBDP, 1 tablet on the tongue and allow to dissolve (Patient not taking: Reported on 10/29/2021), Disp: , Rfl:    rosuvastatin (CRESTOR) 10 MG tablet, Take 10 mg by mouth daily., Disp: , Rfl:    SUMAtriptan (IMITREX) 25 MG tablet, Take 1 tablet (25 mg total) by mouth every 2 (two) hours as needed for migraine. May repeat in 2 hours if headache persists or recurs. (Patient not taking: Reported on 10/29/2021), Disp: 15 tablet, Rfl: 11   topiramate (TOPAMAX) 50 MG tablet, Take 1/2 tablet at bedtime for one week, then increase to 1 tablet at bedtime, Disp: 30 tablet, Rfl: 0   Ubrogepant (UBRELVY) 50 MG TABS, 1 tablet may take second dose at least 2 hours after first dose as needed (Patient not taking: Reported on 10/29/2021), Disp: , Rfl:   Social History   Tobacco Use  Smoking Status Never  Smokeless Tobacco Never    Allergies  Allergen Reactions   Other Nausea And Vomiting and Other (See Comments)    Dairy products - eggs, cheese, milk    Sulfa Antibiotics Other (See Comments)    THRUSH   Objective:  There were no vitals filed for this visit. There is no height or weight on file to calculate BMI. Constitutional Well developed. Well nourished.  Vascular Dorsalis pedis pulses palpable bilaterally. Posterior tibial pulses palpable bilaterally. Capillary refill normal to all digits.  No cyanosis or clubbing noted. Pedal hair growth normal.  Neurologic Normal speech. Oriented to person, place, and time. Epicritic sensation to light touch grossly present bilaterally.  Dermatologic Nails well groomed and  normal in appearance. No open wounds. No skin lesions.  Orthopedic: Pain on palpation of right second metatarsophalangeal joint pain with range of motion of the joint.  Unable to rule out Mulder's click/neuroma.  Negative extensor or flexor tendinitis noted.  No plantar plate rupture noted.   Radiographs: Previous hardware noted from Weil osteotomy of right second and third.  Good correction alignment noted metatarsal parabola appears to be intact. Assessment:   1. Capsulitis of metatarsophalangeal (MTP) joints of both feet   2. Morton's neuroma of right foot    Plan:  Patient was evaluated and treated and all questions answered.  Right second metatarsophalangeal joint capsulitis versus neuroma -All questions and concerns were discussed with the patient in extensive detail given the amount of pain  that she is experiencing she will benefit from a steroid injection help decrease acute inflammatory component associate with pain.  Patient agrees with plan like to proceed with steroid injection A steroid injection was performed at right second MTP using 1% plain Lidocaine and 10 mg of Kenalog. This was well tolerated. -I will also order an MRI to rule out neuroma/capsulitis  No follow-ups on file.

## 2022-07-09 ENCOUNTER — Encounter (HOSPITAL_COMMUNITY): Payer: Self-pay | Admitting: Gastroenterology

## 2022-07-15 ENCOUNTER — Other Ambulatory Visit: Payer: PRIVATE HEALTH INSURANCE

## 2022-07-15 ENCOUNTER — Ambulatory Visit
Admission: RE | Admit: 2022-07-15 | Discharge: 2022-07-15 | Disposition: A | Discharge status: Home / Self Care | Payer: PRIVATE HEALTH INSURANCE | Source: Ambulatory Visit | Attending: Podiatry | Admitting: Podiatry

## 2022-07-15 ENCOUNTER — Encounter (HOSPITAL_COMMUNITY): Payer: Self-pay | Admitting: Gastroenterology

## 2022-07-15 DIAGNOSIS — M7751 Other enthesopathy of right foot: Secondary | ICD-10-CM

## 2022-07-15 MED ORDER — GADOBENATE DIMEGLUMINE 529 MG/ML IV SOLN
13.0000 mL | Freq: Once | INTRAVENOUS | Status: AC | PRN
  Administered 2022-07-15: 13 mL via INTRAVENOUS

## 2022-07-15 NOTE — Anesthesia Preprocedure Evaluation (Signed)
Anesthesia Evaluation  Patient identified by MRN, date of birth, ID band Patient awake    Reviewed: Allergy & Precautions, H&P , NPO status , Patient's Chart, lab work & pertinent test results  Airway Mallampati: I  TM Distance: >3 FB Neck ROM: Full    Dental no notable dental hx. (+) Teeth Intact, Dental Advisory Given, Edentulous Upper   Pulmonary neg pulmonary ROS,    Pulmonary exam normal breath sounds clear to auscultation       Cardiovascular Exercise Tolerance: Good hypertension, Pt. on medications negative cardio ROS Normal cardiovascular exam Rhythm:Regular Rate:Normal     Neuro/Psych  Headaches, PSYCHIATRIC DISORDERS TIAnegative neurological ROS  negative psych ROS   GI/Hepatic negative GI ROS, Neg liver ROS, GERD  Medicated,  Endo/Other  negative endocrine ROS  Renal/GU negative Renal ROS  negative genitourinary   Musculoskeletal negative musculoskeletal ROS (+) Arthritis , Osteoarthritis,    Abdominal   Peds negative pediatric ROS (+)  Hematology negative hematology ROS (+)   Anesthesia Other Findings   Reproductive/Obstetrics negative OB ROS                            Anesthesia Physical Anesthesia Plan  ASA: 3  Anesthesia Plan: MAC   Post-op Pain Management: Minimal or no pain anticipated   Induction: Intravenous  PONV Risk Score and Plan: 2 and Ondansetron  Airway Management Planned: Natural Airway and Simple Face Mask  Additional Equipment: None  Intra-op Plan:   Post-operative Plan:   Informed Consent: I have reviewed the patients History and Physical, chart, labs and discussed the procedure including the risks, benefits and alternatives for the proposed anesthesia with the patient or authorized representative who has indicated his/her understanding and acceptance.       Plan Discussed with: Anesthesiologist and CRNA  Anesthesia Plan Comments:         Anesthesia Quick Evaluation

## 2022-07-16 ENCOUNTER — Ambulatory Visit (HOSPITAL_COMMUNITY)
Admission: RE | Admit: 2022-07-16 | Discharge: 2022-07-16 | Disposition: A | Discharge status: Home / Self Care | Payer: PRIVATE HEALTH INSURANCE | Attending: Gastroenterology | Admitting: Gastroenterology

## 2022-07-16 ENCOUNTER — Encounter (HOSPITAL_COMMUNITY): Payer: Self-pay | Admitting: Gastroenterology

## 2022-07-16 ENCOUNTER — Ambulatory Visit (HOSPITAL_COMMUNITY): Payer: PRIVATE HEALTH INSURANCE | Admitting: Anesthesiology

## 2022-07-16 ENCOUNTER — Ambulatory Visit (HOSPITAL_BASED_OUTPATIENT_CLINIC_OR_DEPARTMENT_OTHER): Payer: PRIVATE HEALTH INSURANCE | Admitting: Anesthesiology

## 2022-07-16 ENCOUNTER — Other Ambulatory Visit: Payer: Self-pay

## 2022-07-16 ENCOUNTER — Encounter (HOSPITAL_COMMUNITY)
Admission: RE | Disposition: A | Discharge status: Home / Self Care | Payer: Self-pay | Source: Home / Self Care | Attending: Gastroenterology

## 2022-07-16 DIAGNOSIS — K6282 Dysplasia of anus: Secondary | ICD-10-CM | POA: Diagnosis not present

## 2022-07-16 DIAGNOSIS — I10 Essential (primary) hypertension: Secondary | ICD-10-CM | POA: Diagnosis not present

## 2022-07-16 DIAGNOSIS — K635 Polyp of colon: Secondary | ICD-10-CM

## 2022-07-16 DIAGNOSIS — K621 Rectal polyp: Secondary | ICD-10-CM | POA: Insufficient documentation

## 2022-07-16 DIAGNOSIS — Z79899 Other long term (current) drug therapy: Secondary | ICD-10-CM | POA: Insufficient documentation

## 2022-07-16 DIAGNOSIS — Z8673 Personal history of transient ischemic attack (TIA), and cerebral infarction without residual deficits: Secondary | ICD-10-CM | POA: Diagnosis not present

## 2022-07-16 DIAGNOSIS — K219 Gastro-esophageal reflux disease without esophagitis: Secondary | ICD-10-CM | POA: Diagnosis not present

## 2022-07-16 DIAGNOSIS — M1990 Unspecified osteoarthritis, unspecified site: Secondary | ICD-10-CM | POA: Insufficient documentation

## 2022-07-16 DIAGNOSIS — K649 Unspecified hemorrhoids: Secondary | ICD-10-CM | POA: Diagnosis not present

## 2022-07-16 HISTORY — PX: BIOPSY: SHX5522

## 2022-07-16 HISTORY — PX: POLYPECTOMY: SHX5525

## 2022-07-16 HISTORY — PX: FLEXIBLE SIGMOIDOSCOPY: SHX5431

## 2022-07-16 SURGERY — SIGMOIDOSCOPY, FLEXIBLE
Anesthesia: Monitor Anesthesia Care

## 2022-07-16 MED ORDER — SODIUM CHLORIDE 0.9 % IV SOLN: INTRAVENOUS | Status: DC

## 2022-07-16 MED ORDER — PROPOFOL 500 MG/50ML IV EMUL
INTRAVENOUS | Status: AC
  Filled 2022-07-16: qty 50

## 2022-07-16 MED ORDER — PROPOFOL 500 MG/50ML IV EMUL
INTRAVENOUS | Status: AC
  Filled 2022-07-16: qty 100

## 2022-07-16 MED ORDER — ONDANSETRON HCL 4 MG/2ML IJ SOLN
INTRAMUSCULAR | Status: DC | PRN
  Administered 2022-07-16: 4 mg via INTRAVENOUS

## 2022-07-16 MED ORDER — PROPOFOL 500 MG/50ML IV EMUL
INTRAVENOUS | Status: DC | PRN
  Administered 2022-07-16: 30 mg via INTRAVENOUS
  Administered 2022-07-16: 135 ug/kg/min via INTRAVENOUS

## 2022-07-16 MED ORDER — LACTATED RINGERS IV SOLN: INTRAVENOUS | Status: DC

## 2022-07-16 MED ORDER — LIDOCAINE HCL (CARDIAC) PF 100 MG/5ML IV SOSY
PREFILLED_SYRINGE | INTRAVENOUS | Status: DC | PRN
  Administered 2022-07-16: 70 mg via INTRAVENOUS

## 2022-07-16 NOTE — Progress Notes (Signed)
Mackenzie Carr 8:28 AM  Subjective: Patient seen and examined and she has not had any problems since she was seen in the office we rediscussed her procedure she has no other complaints  Objective: Vital signs stable afebrile no acute distress exam please see preassessment evaluation  Assessment: Anal rectal polyp seen on colonoscopy not removed at that time initially thought she would need surgical removal but we will try to remove endoscopically first  Plan: Okay to proceed with flex sig with anesthesia assistance  [facility]  office [phone] After 5PM or if no answer call [phone]

## 2022-07-16 NOTE — Discharge Instructions (Addendum)
Call if question or problem otherwise call for biopsy report in 1 week and follow-up as needed and will decide repeat colonoscopy based on pathology  YOU HAD AN ENDOSCOPIC PROCEDURE TODAY: Refer to the procedure report and other information in the discharge instructions given to you for any specific questions about what was found during the examination. If this information does not answer your questions, please call [facility] office at [phone] to clarify.   YOU SHOULD EXPECT: Some feelings of bloating in the abdomen. Passage of more gas than usual. Walking can help get rid of the air that was put into your GI tract during the procedure and reduce the bloating. If you had a lower endoscopy (such as a colonoscopy or flexible sigmoidoscopy) you may notice spotting of blood in your stool or on the toilet paper. Some abdominal soreness may be present for a day or two, also.  DIET: Your first meal following the procedure should be a light meal and then it is ok to progress to your normal diet. A half-sandwich or bowl of soup is an example of a good first meal. Heavy or fried foods are harder to digest and may make you feel nauseous or bloated. Drink plenty of fluids but you should avoid alcoholic beverages for 24 hours. If you had a esophageal dilation, please see attached instructions for diet.    ACTIVITY: Your care partner should take you home directly after the procedure. You should plan to take it easy, moving slowly for the rest of the day. You can resume normal activity the day after the procedure however YOU SHOULD NOT DRIVE, use power tools, machinery or perform tasks that involve climbing or major physical exertion for 24 hours (because of the sedation medicines used during the test).   SYMPTOMS TO REPORT IMMEDIATELY: A gastroenterologist can be reached at any hour. Please call [phone]  for any of the following symptoms:  Following lower endoscopy (colonoscopy, flexible  sigmoidoscopy) Excessive amounts of blood in the stool  Significant tenderness, worsening of abdominal pains  Swelling of the abdomen that is new, acute  Fever of 100 or higher  Following upper endoscopy (EGD, EUS, ERCP, esophageal dilation) Vomiting of blood or coffee ground material  New, significant abdominal pain  New, significant chest pain or pain under the shoulder blades  Painful or persistently difficult swallowing  New shortness of breath  Black, tarry-looking or red, bloody stools  FOLLOW UP:  If any biopsies were taken you will be contacted by phone or by letter within the next 1-3 weeks. Call [phone]  if you have not heard about the biopsies in 3 weeks.  Please also call with any specific questions about appointments or follow up tests. YOU HAD AN ENDOSCOPIC PROCEDURE TODAY: Refer to the procedure report and other information in the discharge instructions given to you for any specific questions about what was found during the examination. If this information does not answer your questions, please call [facility] office at [phone] to clarify.   YOU SHOULD EXPECT: Some feelings of bloating in the abdomen. Passage of more gas than usual. Walking can help get rid of the air that was put into your GI tract during the procedure and reduce the bloating. If you had a lower endoscopy (such as a colonoscopy or flexible sigmoidoscopy) you may notice spotting of blood in your stool or on the toilet paper. Some abdominal soreness may be present for a day or two, also.  DIET: Your first meal following  the procedure should be a light meal and then it is ok to progress to your normal diet. A half-sandwich or bowl of soup is an example of a good first meal. Heavy or fried foods are harder to digest and may make you feel nauseous or bloated. Drink plenty of fluids but you should avoid alcoholic beverages for 24 hours. If you had a esophageal dilation, please see attached instructions for diet.     ACTIVITY: Your care partner should take you home directly after the procedure. You should plan to take it easy, moving slowly for the rest of the day. You can resume normal activity the day after the procedure however YOU SHOULD NOT DRIVE, use power tools, machinery or perform tasks that involve climbing or major physical exertion for 24 hours (because of the sedation medicines used during the test).   SYMPTOMS TO REPORT IMMEDIATELY: A gastroenterologist can be reached at any hour. Please call [phone]  for any of the following symptoms:  Following lower endoscopy (colonoscopy, flexible sigmoidoscopy) Excessive amounts of blood in the stool  Significant tenderness, worsening of abdominal pains  Swelling of the abdomen that is new, acute  Fever of 100 or higher  Following upper endoscopy (EGD, EUS, ERCP, esophageal dilation) Vomiting of blood or coffee ground material  New, significant abdominal pain  New, significant chest pain or pain under the shoulder blades  Painful or persistently difficult swallowing  New shortness of breath  Black, tarry-looking or red, bloody stools  FOLLOW UP:  If any biopsies were taken you will be contacted by phone or by letter within the next 1-3 weeks. Call [phone]  if you have not heard about the biopsies in 3 weeks.  Please also call with any specific questions about appointments or follow up tests.

## 2022-07-16 NOTE — Transfer of Care (Signed)
Immediate Anesthesia Transfer of Care Note  Patient: Maxie Barb  Procedure(s) Performed: Procedure(s) with comments: FLEXIBLE SIGMOIDOSCOPY (N/A) - With APC HOT HEMOSTASIS (ARGON PLASMA COAGULATION/BICAP) (N/A) POLYPECTOMY BIOPSY  Patient Location: PACU  Anesthesia Type:MAC  Level of Consciousness:  sedated, patient cooperative and responds to stimulation  Airway & Oxygen Therapy:Patient Spontanous Breathing and Patient connected to face mask oxgen  Post-op Assessment:  Report given to PACU RN and Post -op Vital signs reviewed and stable  Post vital signs:  Reviewed and stable  Last Vitals:  Vitals:   07/16/22 0700 07/16/22 0858  BP: 135/77   Pulse: 72 75  Resp: 13   Temp: 36.7 C   SpO2: 585% 929%    Complications: No apparent anesthesia complications

## 2022-07-16 NOTE — Anesthesia Postprocedure Evaluation (Signed)
Anesthesia Post Note  Patient: Mackenzie Carr  Procedure(s) Performed: FLEXIBLE SIGMOIDOSCOPY HOT HEMOSTASIS (ARGON PLASMA COAGULATION/BICAP) POLYPECTOMY BIOPSY     Patient location during evaluation: PACU Anesthesia Type: MAC Level of consciousness: awake and alert Pain management: pain level controlled Vital Signs Assessment: post-procedure vital signs reviewed and stable Respiratory status: spontaneous breathing, nonlabored ventilation, respiratory function stable and patient connected to nasal cannula oxygen Cardiovascular status: stable and blood pressure returned to baseline Postop Assessment: no apparent nausea or vomiting Anesthetic complications: no   No notable events documented.  Last Vitals:  Vitals:   07/16/22 0915 07/16/22 0916  BP:  117/77  Pulse: 63 71  Resp: 13 15  Temp:    SpO2: 100% 100%    Last Pain:  Vitals:   07/16/22 0916  TempSrc:   PainSc: 0-No pain                 Brasen Bundren

## 2022-07-16 NOTE — Op Note (Signed)
[facility] Patient Name: Mackenzie Carr Procedure Date: 07/16/2022 MRN: [id number] Attending MD: Clarene Essex , MD Date of Birth: [birth year] CSN: [id number] Age: 61 Admit Type: Outpatient Procedure:                Flexible Sigmoidoscopy Indications:              For therapy of anal rectal polyp seen on                            colonoscopy initially thought surgical removal was                            required but elected to try endoscopically first Providers:                Clarene Essex, MD, Benay Pillow, RN, Cherylynn Ridges,                            Technician, Arnoldo Hooker, CRNA Referring MD:              Medicines:                Monitored Anesthesia Care Complications:            No immediate complications. Estimated Blood Loss:     Estimated blood loss: none. Procedure:                Pre-Anesthesia Assessment:                           - Prior to the procedure, a History and Physical                            was performed, and patient medications and                            allergies were reviewed. The patient's tolerance of                            previous anesthesia was also reviewed. The risks                            and benefits of the procedure and the sedation                            options and risks were discussed with the patient.                            All questions were answered, and informed consent                            was obtained. Prior Anticoagulants: The patient has                            taken no previous anticoagulant or antiplatelet  agents. ASA Grade Assessment: II - A patient with                            mild systemic disease. After reviewing the risks                            and benefits, the patient was deemed in                            satisfactory condition to undergo the procedure.                           After obtaining informed consent, the scope was                             passed under direct vision. The [facility]                            COLONOSCOPE [id number] was introduced through the anus                            and advanced to the the sigmoid colon. The flexible                            sigmoidoscopy was accomplished without difficulty.                            The patient tolerated the procedure well. The                            quality of the bowel preparation was adequate. Scope In: 8:36:37 AM Scope Out: 8:51:50 AM Total Procedure Duration: 0 hours 15 minutes 13 seconds  Findings:      Hemorrhoids were found on perianal exam.      A medium polyp was found in the distal rectum right at the anal rectal       junction. The polyp was semi-sessile. The polyp was removed with a       piecemeal technique using a hot snare x2. Resection and retrieval were       complete.      A few {skip} white tiny polyps were found in the rectum and distal       sigmoid colon. The polyps were diminutive in size. These were biopsied       with a cold forceps for histology.      The exam was otherwise without abnormality and exam to the mid sigmoid       not advanced secondary to prep. Impression:               - Hemorrhoids found on perianal exam.                           - One medium polyp in the distal rectum, removed                            piecemeal using a  hot snare. Resected and retrieved.                           - A few diminutive polyps in the rectum and in the                            distal sigmoid colon. Biopsied.                           - The examination was otherwise normal. Moderate Sedation:      Not Applicable - Patient had care per Anesthesia. Recommendation:           - Soft diet today.                           - Continue present medications.                           - Await pathology results.                           - Return to GI clinic PRN.                           - Telephone GI clinic for pathology  results in 1                            week.                           - Telephone GI clinic if symptomatic PRN. Repeat                            colonoscopy based on pathology Procedure Code(s):        --- Professional ---                           [phone], Sigmoidoscopy, flexible; with removal of                            tumor(s), polyp(s), or other lesion(s) by snare                            technique                           [id number], 59, Sigmoidoscopy, flexible; with biopsy,                            single or multiple Diagnosis Code(s):        --- Professional ---                           K62.1, Rectal polyp                           K63.5, Polyp of colon CPT copyright 2019 American Medical  Association. All rights reserved. The codes documented in this report are preliminary and upon coder review may  be revised to meet current compliance requirements. Clarene Essex, MD 07/16/2022 9:11:36 AM This report has been signed electronically. Number of Addenda: 0

## 2022-07-17 ENCOUNTER — Encounter (HOSPITAL_COMMUNITY): Payer: Self-pay | Admitting: Gastroenterology

## 2022-07-17 LAB — SURGICAL PATHOLOGY

## 2022-07-26 ENCOUNTER — Ambulatory Visit: Payer: PRIVATE HEALTH INSURANCE | Admitting: Podiatry

## 2022-08-16 ENCOUNTER — Ambulatory Visit: Payer: PRIVATE HEALTH INSURANCE | Admitting: Podiatry

## 2022-08-23 ENCOUNTER — Ambulatory Visit (INDEPENDENT_AMBULATORY_CARE_PROVIDER_SITE_OTHER): Payer: PRIVATE HEALTH INSURANCE | Admitting: Podiatry

## 2022-08-23 DIAGNOSIS — M7751 Other enthesopathy of right foot: Secondary | ICD-10-CM

## 2022-08-23 DIAGNOSIS — M7752 Other enthesopathy of left foot: Secondary | ICD-10-CM | POA: Diagnosis not present

## 2022-08-23 NOTE — Progress Notes (Signed)
Subjective:  Patient ID: Mackenzie Carr, female    DOB: 20-Mar-1961,  MRN: [id number]  Chief Complaint  Patient presents with   Routine Post Op    POV #6 DOS 12/19/2021 EPF RT    61 y.o. female presents with the above complaint.  Patient presents with right second metatarsophalangeal joint and forefoot metatarsalgia pain.  Patient states that she has started to do with it.  She had the surgery done by Dr. March Rummage where she underwent the following procedure  DOS: 12/19/21 Procedure: 2nd/3rd Weil Osteotomy and Endoscopic Plantar Fasciotomy right  She is to she is managing the pain she is doing a little bit better.  She is here to go over the MRI.    Review of Systems: Negative except as noted in the HPI. Denies N/V/F/Ch.  Past Medical History:  Diagnosis Date   GERD (gastroesophageal reflux disease)    Hyperlipidemia    Hypertension    IBS (irritable bowel syndrome)    Migraines    TIA (transient ischemic attack)    MRI 02/03/10 normal. Echo 3/7/11normal EF,normal. Carotid doppler 02/05/10 normal. Cardionet moniitor-no AFIB    Current Outpatient Medications:    amLODipine (NORVASC) 10 MG tablet, Take 10 mg by mouth in the morning., Disp: , Rfl:    cyclobenzaprine (FLEXERIL) 10 MG tablet, Take 10 mg by mouth 3 (three) times daily as needed for muscle spasms., Disp: , Rfl:    fexofenadine (ALLEGRA) 180 MG tablet, Take 180 mg by mouth in the morning., Disp: , Rfl:    gabapentin (NEURONTIN) 300 MG capsule, Take 1 capsule (300 mg total) by mouth 3 (three) times daily. (Patient taking differently: Take 300 mg by mouth 3 (three) times daily as needed (pain.).), Disp: 90 capsule, Rfl: 3   hydrochlorothiazide (HYDRODIURIL) 25 MG tablet, Take 25 mg by mouth in the morning., Disp: , Rfl:    ibuprofen (ADVIL) 800 MG tablet, Take 800 mg by mouth 3 (three) times daily as needed (pain.)., Disp: , Rfl:    ibuprofen (ADVIL,MOTRIN) 200 MG tablet, Take 200-400 mg by mouth every 6 (six) hours as  needed (For pain.)., Disp: , Rfl:    lisinopril (PRINIVIL,ZESTRIL) 20 MG tablet, Take 20 mg by mouth in the morning., Disp: , Rfl:    meloxicam (MOBIC) 15 MG tablet, Take 1 tablet (15 mg total) by mouth daily. (Patient taking differently: Take 15 mg by mouth daily as needed for pain.), Disp: 30 tablet, Rfl: 0   mometasone (ELOCON) 0.1 % cream, Apply 1 Application topically daily., Disp: , Rfl:    oxyCODONE-acetaminophen (PERCOCET) 5-325 MG tablet, Take 1 tablet by mouth every 4 (four) hours as needed for severe pain., Disp: 20 tablet, Rfl: 0   pantoprazole (PROTONIX) 40 MG tablet, Take 40 mg by mouth daily., Disp: , Rfl:    SUMAtriptan (IMITREX) 25 MG tablet, Take 1 tablet (25 mg total) by mouth every 2 (two) hours as needed for migraine. May repeat in 2 hours if headache persists or recurs., Disp: 15 tablet, Rfl: 11   Ubrogepant (UBRELVY) 50 MG TABS, Take 50 mg by mouth daily as needed (migraine)., Disp: , Rfl:   Social History   Tobacco Use  Smoking Status Never  Smokeless Tobacco Never    Allergies  Allergen Reactions   Food Other (See Comments)    Nuts-heart palpitations (heart races)   Other Nausea And Vomiting and Other (See Comments)    Dairy products - eggs, cheese, milk    Sulfa Antibiotics Other (See  Comments)    THRUSH   Objective:  There were no vitals filed for this visit. There is no height or weight on file to calculate BMI. Constitutional Well developed. Well nourished.  Vascular Dorsalis pedis pulses palpable bilaterally. Posterior tibial pulses palpable bilaterally. Capillary refill normal to all digits.  No cyanosis or clubbing noted. Pedal hair growth normal.  Neurologic Normal speech. Oriented to person, place, and time. Epicritic sensation to light touch grossly present bilaterally.  Dermatologic Nails well groomed and normal in appearance. No open wounds. No skin lesions.  Orthopedic: Mild pain on palpation of right second metatarsophalangeal joint pain  with range of motion of the joint.  Unable to rule out Mulder's click/neuroma.  Negative extensor or flexor tendinitis noted.  No plantar plate rupture noted.   Radiographs: Previous hardware noted from Weil osteotomy of right second and third.  Good correction alignment noted metatarsal parabola appears to be intact.  IMPRESSION: 1. No acute abnormality of the right forefoot. 2. Mild osteoarthritis of the first MTP joint. Assessment:   1. Capsulitis of metatarsophalangeal (MTP) joints of both feet     Plan:  Patient was evaluated and treated and all questions answered.  Right second metatarsophalangeal joint capsulitis versus neuroma -All questions and concerns were discussed with the patient in extensive detail  -Clinically she is doing better and her pain is more manageable.  MRI was reviewed with the patient in extensive detail no abnormality could be appreciated in the forefoot.  At this time patient will continue with conservative care if any foot and ankle issues on future advised her to come back and see me.  She states understanding. No follow-ups on file.

## 2022-10-22 ENCOUNTER — Ambulatory Visit: Payer: PRIVATE HEALTH INSURANCE | Admitting: Neurology

## 2022-11-01 ENCOUNTER — Other Ambulatory Visit: Payer: Self-pay | Admitting: Gastroenterology

## 2022-11-01 DIAGNOSIS — K869 Disease of pancreas, unspecified: Secondary | ICD-10-CM

## 2022-12-07 ENCOUNTER — Ambulatory Visit
Admission: RE | Admit: 2022-12-07 | Discharge: 2022-12-07 | Disposition: A | Discharge status: Home / Self Care | Payer: PRIVATE HEALTH INSURANCE | Source: Ambulatory Visit | Attending: Gastroenterology | Admitting: Gastroenterology

## 2022-12-07 DIAGNOSIS — K869 Disease of pancreas, unspecified: Secondary | ICD-10-CM

## 2022-12-07 MED ORDER — GADOPICLENOL 0.5 MMOL/ML IV SOLN
6.5000 mL | Freq: Once | INTRAVENOUS | Status: AC | PRN
Start: 2022-12-07 — End: 2022-12-07
  Administered 2022-12-07: 6.5 mL via INTRAVENOUS

## 2023-02-05 NOTE — Progress Notes (Deleted)
NEUROLOGY FOLLOW UP OFFICE NOTE  Mackenzie Carr [id number]  Assessment/Plan:   Migraine without aura, with status migrainosus, intractable ***   Migraine prevention:  start topiramate '25mg'$  at bedtime for one week, then '50mg'$  at bedtime Migraine rescue:  earliest onset of headache, Nurtec - take one daily up to 7 days or once headache resolved.  Never took it at earliest onset of migraine.  Due to history of TIA, triptans are contraindicated. Limit use of pain relievers to no more than 2 days out of week to prevent risk of rebound or medication-overuse headache. Keep headache diary Follow up 6 months.       Subjective:  Mackenzie Carr is a 62 year old female who follows up for migraines.  UPDATE: Last seen in initial consultation in November 2022.  At that time, I started her on topiramate nurtec ***  Intensity:  *** Duration:  *** Frequency:  *** Frequency of abortive medication: *** Current NSAIDS/analgesics:  ASA '81mg'$  daily, acetaminophen, ibuprofen, meloxicam, oxycodone-acetaminophen Current triptans:  none Current ergotamine:  none Current anti-emetic:  Zofran '4mg'$  Current muscle relaxants:  cylcobenzaprine Current Antihypertensive medications:  amlodipine, lisinopril, HCTZ Current Antidepressant medications:  none Current Anticonvulsant medications:  topiramate '50mg'$  at bedtime *** Current anti-CGRP:  Nurtec *** Current Vitamins/Herbal/Supplements:  none Current Antihistamines/Decongestants:  Allegra Other therapy:  heat to shoulders Hormone/birth control:  none Other medications:  none    HISTORY: Onset:  Off and on for many years.  Headache-free for many years.  However she had 3 intractable migraines in 2022. Location:  diffuse Quality:  pressure Intensity:  Severe.  she denies new headache, thunderclap headache or severe headache that wakes her from sleep. Aura:  absent Prodrome:  absent Associated symptoms:  Nausea, photophobia,  phonophobia.  She denies associated vomiting, visual disturbance or unilateral numbness or weakness. Duration:  10 days Frequency:  3 in last year (last was 3 months ago) Triggers:  strong smells Relieving factors:  heating pads on shoulders Activity:  aggravates   She has had several head CTs over the past 10 years to evaluate headache.  Last CT head on 01/11/2021 personally reviewed was unremarkable.  She had what was diagnosed as a TIA on 02/03/2010 presenting as syncope followed by right leg weakness.  She had an MRI/MRA of the head, which was personally reviewed and demonstrated mild chronic small vessel ischemic changes but otherwise unremarkable.      Past NSAIDS/analgesics:  tramadol Past abortive triptans:  sumatriptan tab Past abortive ergotamine:  none Past muscle relaxants:  none Past anti-emetic:  none Past antihypertensive medications:  none Past antidepressant medications:  nortriptyline Past anticonvulsant medications:  none Past anti-CGRP:  Ubrelvy, Nurtec (took in middle of intractable migraine) Past vitamins/Herbal/Supplements:  none Past antihistamines/decongestants:  none Other past therapies:  prednisone  PAST MEDICAL HISTORY: Past Medical History:  Diagnosis Date   GERD (gastroesophageal reflux disease)    Hyperlipidemia    Hypertension    IBS (irritable bowel syndrome)    Migraines    TIA (transient ischemic attack)    MRI 02/03/10 normal. Echo 3/7/11normal EF,normal. Carotid doppler 02/05/10 normal. Cardionet moniitor-no AFIB    MEDICATIONS: Current Outpatient Medications on File Prior to Visit  Medication Sig Dispense Refill   amLODipine (NORVASC) 10 MG tablet Take 10 mg by mouth in the morning.     cyclobenzaprine (FLEXERIL) 10 MG tablet Take 10 mg by mouth 3 (three) times daily as needed for muscle spasms.     fexofenadine (ALLEGRA)  180 MG tablet Take 180 mg by mouth in the morning.     gabapentin (NEURONTIN) 300 MG capsule Take 1 capsule (300 mg total)  by mouth 3 (three) times daily. (Patient taking differently: Take 300 mg by mouth 3 (three) times daily as needed (pain.).) 90 capsule 3   hydrochlorothiazide (HYDRODIURIL) 25 MG tablet Take 25 mg by mouth in the morning.     ibuprofen (ADVIL) 800 MG tablet Take 800 mg by mouth 3 (three) times daily as needed (pain.).     ibuprofen (ADVIL,MOTRIN) 200 MG tablet Take 200-400 mg by mouth every 6 (six) hours as needed (For pain.).     lisinopril (PRINIVIL,ZESTRIL) 20 MG tablet Take 20 mg by mouth in the morning.     meloxicam (MOBIC) 15 MG tablet Take 1 tablet (15 mg total) by mouth daily. (Patient taking differently: Take 15 mg by mouth daily as needed for pain.) 30 tablet 0   mometasone (ELOCON) 0.1 % cream Apply 1 Application topically daily.     oxyCODONE-acetaminophen (PERCOCET) 5-325 MG tablet Take 1 tablet by mouth every 4 (four) hours as needed for severe pain. 20 tablet 0   pantoprazole (PROTONIX) 40 MG tablet Take 40 mg by mouth daily.     SUMAtriptan (IMITREX) 25 MG tablet Take 1 tablet (25 mg total) by mouth every 2 (two) hours as needed for migraine. May repeat in 2 hours if headache persists or recurs. 15 tablet 11   Ubrogepant (UBRELVY) 50 MG TABS Take 50 mg by mouth daily as needed (migraine).     No current facility-administered medications on file prior to visit.    ALLERGIES: Allergies  Allergen Reactions   Food Other (See Comments)    Nuts-heart palpitations (heart races)   Other Nausea And Vomiting and Other (See Comments)    Dairy products - eggs, cheese, milk    Sulfa Antibiotics Other (See Comments)    THRUSH    FAMILY HISTORY: Family History  Problem Relation Age of Onset   Stroke Mother    Heart disease Mother        Pacemaker   Diabetes Father    Throat cancer Father    Migraines Sister    Diabetes Sister    Pancreatic cancer Sister    Stroke Maternal Grandfather    Cancer Maternal Grandfather        unsure of type   Cancer Paternal Grandfather         unsure of type      Objective:  *** General: No acute distress.  Patient appears ***-groomed.   Head:  Normocephalic/atraumatic Eyes:  Fundi examined but not visualized Neck: supple, no paraspinal tenderness, full range of motion Heart:  Regular rate and rhythm Lungs:  Clear to auscultation bilaterally Back: No paraspinal tenderness Neurological Exam: alert and oriented to person, place, and time.  Speech fluent and not dysarthric, language intact.  CN II-XII intact. Bulk and tone normal, muscle strength 5/5 throughout.  Sensation to light touch intact.  Deep tendon reflexes 2+ throughout, toes downgoing.  Finger to nose testing intact.  Gait normal, Romberg negative.   Metta Clines, DO  CC: ***

## 2023-02-06 ENCOUNTER — Ambulatory Visit: Payer: PRIVATE HEALTH INSURANCE | Admitting: Neurology

## 2023-02-06 ENCOUNTER — Encounter: Payer: Self-pay | Admitting: Neurology

## 2023-02-06 DIAGNOSIS — Z029 Encounter for administrative examinations, unspecified: Secondary | ICD-10-CM

## 2023-02-10 ENCOUNTER — Telehealth: Payer: Self-pay

## 2023-02-10 NOTE — Progress Notes (Unsigned)
 NEUROLOGY FOLLOW UP OFFICE NOTE  Mackenzie Carr [id number]  Assessment/Plan:   Migraine without aura, with status migrainosus, intractable ***   Migraine prevention:  start topiramate 25mg at bedtime for one week, then 50mg at bedtime Migraine rescue:  earliest onset of headache, Nurtec - take one daily up to 7 days or once headache resolved.  Never took it at earliest onset of migraine.  Due to history of TIA, triptans are contraindicated. Limit use of pain relievers to no more than 2 days out of week to prevent risk of rebound or medication-overuse headache. Keep headache diary Follow up 6 months.       Subjective:  Mackenzie Carr is a 62 year old female who follows up for migraines.  UPDATE: Last seen in initial consultation in November 2022.  At that time, I started her on topiramate nurtec ***  Intensity:  *** Duration:  *** Frequency:  *** Frequency of abortive medication: *** Current NSAIDS/analgesics:  ASA 81mg daily, acetaminophen, ibuprofen, meloxicam, oxycodone-acetaminophen Current triptans:  none Current ergotamine:  none Current anti-emetic:  Zofran 4mg Current muscle relaxants:  cylcobenzaprine Current Antihypertensive medications:  amlodipine, lisinopril, HCTZ Current Antidepressant medications:  none Current Anticonvulsant medications:  topiramate 50mg at bedtime *** Current anti-CGRP:  Nurtec *** Current Vitamins/Herbal/Supplements:  none Current Antihistamines/Decongestants:  Allegra Other therapy:  heat to shoulders Hormone/birth control:  none Other medications:  none    HISTORY: Onset:  Off and on for many years.  Headache-free for many years.  However she had 3 intractable migraines in 2022. Location:  diffuse Quality:  pressure Intensity:  Severe.  she denies new headache, thunderclap headache or severe headache that wakes her from sleep. Aura:  absent Prodrome:  absent Associated symptoms:  Nausea, photophobia,  phonophobia.  She denies associated vomiting, visual disturbance or unilateral numbness or weakness. Duration:  10 days Frequency:  3 in last year (last was 3 months ago) Triggers:  strong smells Relieving factors:  heating pads on shoulders Activity:  aggravates   She has had several head CTs over the past 10 years to evaluate headache.  Last CT head on 01/11/2021 personally reviewed was unremarkable.  She had what was diagnosed as a TIA on 02/03/2010 presenting as syncope followed by right leg weakness.  She had an MRI/MRA of the head, which was personally reviewed and demonstrated mild chronic small vessel ischemic changes but otherwise unremarkable.      Past NSAIDS/analgesics:  tramadol Past abortive triptans:  sumatriptan tab Past abortive ergotamine:  none Past muscle relaxants:  none Past anti-emetic:  none Past antihypertensive medications:  none Past antidepressant medications:  nortriptyline Past anticonvulsant medications:  none Past anti-CGRP:  Ubrelvy, Nurtec (took in middle of intractable migraine) Past vitamins/Herbal/Supplements:  none Past antihistamines/decongestants:  none Other past therapies:  prednisone  PAST MEDICAL HISTORY: Past Medical History:  Diagnosis Date   GERD (gastroesophageal reflux disease)    Hyperlipidemia    Hypertension    IBS (irritable bowel syndrome)    Migraines    TIA (transient ischemic attack)    MRI 02/03/10 normal. Echo 3/7/11normal EF,normal. Carotid doppler 02/05/10 normal. Cardionet moniitor-no AFIB    MEDICATIONS: Current Outpatient Medications on File Prior to Visit  Medication Sig Dispense Refill   amLODipine (NORVASC) 10 MG tablet Take 10 mg by mouth in the morning.     cyclobenzaprine (FLEXERIL) 10 MG tablet Take 10 mg by mouth 3 (three) times daily as needed for muscle spasms.     fexofenadine (ALLEGRA)   180 MG tablet Take 180 mg by mouth in the morning.     gabapentin (NEURONTIN) 300 MG capsule Take 1 capsule (300 mg total)  by mouth 3 (three) times daily. (Patient taking differently: Take 300 mg by mouth 3 (three) times daily as needed (pain.).) 90 capsule 3   hydrochlorothiazide (HYDRODIURIL) 25 MG tablet Take 25 mg by mouth in the morning.     ibuprofen (ADVIL) 800 MG tablet Take 800 mg by mouth 3 (three) times daily as needed (pain.).     ibuprofen (ADVIL,MOTRIN) 200 MG tablet Take 200-400 mg by mouth every 6 (six) hours as needed (For pain.).     lisinopril (PRINIVIL,ZESTRIL) 20 MG tablet Take 20 mg by mouth in the morning.     meloxicam (MOBIC) 15 MG tablet Take 1 tablet (15 mg total) by mouth daily. (Patient taking differently: Take 15 mg by mouth daily as needed for pain.) 30 tablet 0   mometasone (ELOCON) 0.1 % cream Apply 1 Application topically daily.     oxyCODONE-acetaminophen (PERCOCET) 5-325 MG tablet Take 1 tablet by mouth every 4 (four) hours as needed for severe pain. 20 tablet 0   pantoprazole (PROTONIX) 40 MG tablet Take 40 mg by mouth daily.     SUMAtriptan (IMITREX) 25 MG tablet Take 1 tablet (25 mg total) by mouth every 2 (two) hours as needed for migraine. May repeat in 2 hours if headache persists or recurs. 15 tablet 11   Ubrogepant (UBRELVY) 50 MG TABS Take 50 mg by mouth daily as needed (migraine).     No current facility-administered medications on file prior to visit.    ALLERGIES: Allergies  Allergen Reactions   Food Other (See Comments)    Nuts-heart palpitations (heart races)   Other Nausea And Vomiting and Other (See Comments)    Dairy products - eggs, cheese, milk    Sulfa Antibiotics Other (See Comments)    THRUSH    FAMILY HISTORY: Family History  Problem Relation Age of Onset   Stroke Mother    Heart disease Mother        Pacemaker   Diabetes Father    Throat cancer Father    Migraines Sister    Diabetes Sister    Pancreatic cancer Sister    Stroke Maternal Grandfather    Cancer Maternal Grandfather        unsure of type   Cancer Paternal Grandfather         unsure of type      Objective:  *** General: No acute distress.  Patient appears ***-groomed.   Head:  Normocephalic/atraumatic Eyes:  Fundi examined but not visualized Neck: supple, no paraspinal tenderness, full range of motion Heart:  Regular rate and rhythm Lungs:  Clear to auscultation bilaterally Back: No paraspinal tenderness Neurological Exam: alert and oriented to person, place, and time.  Speech fluent and not dysarthric, language intact.  CN II-XII intact. Bulk and tone normal, muscle strength 5/5 throughout.  Sensation to light touch intact.  Deep tendon reflexes 2+ throughout, toes downgoing.  Finger to nose testing intact.  Gait normal, Romberg negative.   Iliani Vejar, DO  CC: ***       

## 2023-02-11 ENCOUNTER — Encounter: Payer: Self-pay | Admitting: Neurology

## 2023-02-11 ENCOUNTER — Ambulatory Visit (INDEPENDENT_AMBULATORY_CARE_PROVIDER_SITE_OTHER): Payer: PRIVATE HEALTH INSURANCE | Admitting: Neurology

## 2023-02-11 VITALS — BP 114/74 | HR 79 | Ht 62.0 in | Wt 152.0 lb

## 2023-02-11 DIAGNOSIS — G43011 Migraine without aura, intractable, with status migrainosus: Secondary | ICD-10-CM | POA: Diagnosis not present

## 2023-02-11 MED ORDER — RIZATRIPTAN BENZOATE 10 MG PO TABS: 10.0000 mg | ORAL_TABLET | ORAL | 5 refills | Status: DC | PRN

## 2023-02-11 NOTE — Telephone Encounter (Signed)
Form has been created and ready for pick up or we can mail it to the patients address we have on file.

## 2023-02-11 NOTE — Telephone Encounter (Signed)
Patient will be pick up the form today.

## 2023-02-11 NOTE — Patient Instructions (Addendum)
On first day of migraine, take Nurtec one daily for 8 days (or until headache resolves) Follow up 6 months

## 2023-08-15 NOTE — Progress Notes (Unsigned)
NEUROLOGY FOLLOW UP OFFICE NOTE  [name] [id number]  Assessment/Plan:   Migraine without aura, without status migrainosus, not intractable    Migraine prevention:  deferred Migraine rescue:  earliest onset of headache, Nurtec - take one daily up to 7 days or once headache resolved.  Due to history of TIA, triptans are contraindicated. Limit use of pain relievers to no more than 2 days out of week to prevent risk of rebound or medication-overuse headache. Keep headache diary Follow up 6 months.       Subjective:  Mackenzie Carr is a 62 year old female who follows up for migraines.  UPDATE: No significant migraines since last visit.  Has not needed the Nurtec samples yet.  Avoids triggers when she can.   Current NSAIDS/analgesics:  ASA 81mg  daily, acetaminophen, ibuprofen, meloxicam Current triptans:  none Current ergotamine:  none Current anti-emetic:  Zofran 4mg  Current muscle relaxants:  cylcobenzaprine Current Antihypertensive medications:  amlodipine, lisinopril, HCTZ Current Antidepressant medications:  none Current Anticonvulsant medications:  none Current anti-CGRP:  Nurtec PRN Current Vitamins/Herbal/Supplements:  none Current Antihistamines/Decongestants:  Allegra Other therapy:  heat to shoulders Hormone/birth control:  none Other medications:  none    HISTORY: Onset:  Off and on for many years.  Headache-free for many years.  However she had 3 intractable migraines in 2022. Location:  diffuse Quality:  pressure Intensity:  Severe.  she denies new headache, thunderclap headache or severe headache that wakes her from sleep. Aura:  absent Prodrome:  absent Associated symptoms:  Nausea, photophobia, phonophobia.  She denies associated vomiting, visual disturbance or unilateral numbness or weakness. Duration:  10 days Frequency:  3 in last year (last was 3 months ago) Triggers:  strong smells Relieving factors:  heating pads on  shoulders Activity:  aggravates   She has had several head CTs over the past 10 years to evaluate headache.  Last CT head on 01/11/2021 personally reviewed was unremarkable.  She had what was diagnosed as a TIA on 02/03/2010 presenting as syncope followed by right leg weakness.  She had an MRI/MRA of the head, which was personally reviewed and demonstrated mild chronic small vessel ischemic changes but otherwise unremarkable.      Past NSAIDS/analgesics:  tramadol Past abortive triptans:  sumatriptan tab Past abortive ergotamine:  none Past muscle relaxants:  none Past anti-emetic:  none Past antihypertensive medications:  none Past antidepressant medications:  nortriptyline Past anticonvulsant medications:  none Past anti-CGRP:  Bernita Raisin Past vitamins/Herbal/Supplements:  none Past antihistamines/decongestants:  none Other past therapies:  prednisone  PAST MEDICAL HISTORY: Past Medical History:  Diagnosis Date   GERD (gastroesophageal reflux disease)    Hyperlipidemia    Hypertension    IBS (irritable bowel syndrome)    Migraines    TIA (transient ischemic attack)    MRI 02/03/10 normal. Echo 3/7/11normal EF,normal. Carotid doppler 02/05/10 normal. Cardionet moniitor-no AFIB    MEDICATIONS: Current Outpatient Medications on File Prior to Visit  Medication Sig Dispense Refill   amLODipine (NORVASC) 10 MG tablet Take 10 mg by mouth in the morning.     cyclobenzaprine (FLEXERIL) 10 MG tablet Take 10 mg by mouth 3 (three) times daily as needed for muscle spasms.     fexofenadine (ALLEGRA) 180 MG tablet Take 180 mg by mouth in the morning.     gabapentin (NEURONTIN) 300 MG capsule Take 1 capsule (300 mg total) by mouth 3 (three) times daily. (Patient taking differently: Take 300 mg by mouth 3 (three) times  daily as needed (pain.).) 90 capsule 3   hydrochlorothiazide (HYDRODIURIL) 25 MG tablet Take 25 mg by mouth in the morning.     ibuprofen (ADVIL) 800 MG tablet Take 800 mg by mouth 3  (three) times daily as needed (pain.).     ibuprofen (ADVIL,MOTRIN) 200 MG tablet Take 200-400 mg by mouth every 6 (six) hours as needed (For pain.).     lisinopril (PRINIVIL,ZESTRIL) 20 MG tablet Take 20 mg by mouth in the morning.     meloxicam (MOBIC) 15 MG tablet Take 1 tablet (15 mg total) by mouth daily. (Patient taking differently: Take 15 mg by mouth daily as needed for pain.) 30 tablet 0   mometasone (ELOCON) 0.1 % cream Apply 1 Application topically daily.     oxyCODONE-acetaminophen (PERCOCET) 5-325 MG tablet Take 1 tablet by mouth every 4 (four) hours as needed for severe pain. 20 tablet 0   pantoprazole (PROTONIX) 40 MG tablet Take 40 mg by mouth daily.     No current facility-administered medications on file prior to visit.    ALLERGIES: Allergies  Allergen Reactions   Food Other (See Comments)    Nuts-heart palpitations (heart races)   Other Nausea And Vomiting and Other (See Comments)    Dairy products - eggs, cheese, milk    Sulfa Antibiotics Other (See Comments)    THRUSH    FAMILY HISTORY: Family History  Problem Relation Age of Onset   Stroke Mother    Heart disease Mother        Pacemaker   Diabetes Father    Throat cancer Father    Migraines Sister    Diabetes Sister    Pancreatic cancer Sister    Stroke Maternal Grandfather    Cancer Maternal Grandfather        unsure of type   Cancer Paternal Grandfather        unsure of type      Objective:  Blood pressure 129/75, pulse 78, height 5\' 2"  (1.575 m), weight 154 lb 3.2 oz (69.9 kg), SpO2 99%. General: No acute distress.  Patient appears well-groomed.     Shon Millet, DO  CC: Mackenzie Brunette, MD

## 2023-08-18 ENCOUNTER — Encounter: Payer: Self-pay | Admitting: Neurology

## 2023-08-18 ENCOUNTER — Ambulatory Visit (INDEPENDENT_AMBULATORY_CARE_PROVIDER_SITE_OTHER): Payer: PRIVATE HEALTH INSURANCE | Admitting: Neurology

## 2023-08-18 VITALS — BP 129/75 | HR 78 | Ht 62.0 in | Wt 154.2 lb

## 2023-08-18 DIAGNOSIS — G43009 Migraine without aura, not intractable, without status migrainosus: Secondary | ICD-10-CM | POA: Diagnosis not present

## 2023-08-18 NOTE — Patient Instructions (Signed)
At earliest onset of headache, take Nurtec - take one daily up to 7 days or once headache resolved.  Let me know if it works

## 2023-08-26 ENCOUNTER — Telehealth: Payer: Self-pay

## 2023-08-26 NOTE — Telephone Encounter (Signed)
Patient called wanting to know if she could get another temporary handicap placard.

## 2023-08-27 NOTE — Telephone Encounter (Signed)
Called patient and left message informing her that form was ready to be picked up

## 2023-09-24 ENCOUNTER — Encounter: Payer: Self-pay | Admitting: Podiatry

## 2023-09-24 ENCOUNTER — Ambulatory Visit (INDEPENDENT_AMBULATORY_CARE_PROVIDER_SITE_OTHER): Payer: PRIVATE HEALTH INSURANCE | Admitting: Podiatry

## 2023-09-24 DIAGNOSIS — G5762 Lesion of plantar nerve, left lower limb: Secondary | ICD-10-CM

## 2023-09-24 DIAGNOSIS — M7752 Other enthesopathy of left foot: Secondary | ICD-10-CM | POA: Diagnosis not present

## 2023-09-24 NOTE — Progress Notes (Signed)
Subjective:  Patient ID: Mackenzie Carr, female    DOB: 03-24-1961,  MRN: [id number]  Chief Complaint  Patient presents with   Foot Pain    PATIENT STATES THAT SHE HAS BEEN HAVING MORE PAIN WITH THE LF AND RF . PATIENT STATES THAT SHE HAS BEEN TAKING TYLENOL FOR PAIN .  BOTTOM AND TOP OF HER FEET AD THE FRONT BILATERAL FEET.    62 y.o. female presents with the above complaint.  Patient presents now with a new complaint left second metatarsophalangeal joint pain.  Patient states she is takes Tylenol for pain.  She is doing better with the right foot denies any other acute complaints wants to discuss steroid injection at this time.    Review of Systems: Negative except as noted in the HPI. Denies N/V/F/Ch.  Past Medical History:  Diagnosis Date   GERD (gastroesophageal reflux disease)    Hyperlipidemia    Hypertension    IBS (irritable bowel syndrome)    Migraines    TIA (transient ischemic attack)    MRI 02/03/10 normal. Echo 3/7/11normal EF,normal. Carotid doppler 02/05/10 normal. Cardionet moniitor-no AFIB    Current Outpatient Medications:    amLODipine (NORVASC) 10 MG tablet, Take 10 mg by mouth in the morning., Disp: , Rfl:    cyclobenzaprine (FLEXERIL) 10 MG tablet, Take 10 mg by mouth 3 (three) times daily as needed for muscle spasms., Disp: , Rfl:    fexofenadine (ALLEGRA) 180 MG tablet, Take 180 mg by mouth in the morning., Disp: , Rfl:    gabapentin (NEURONTIN) 300 MG capsule, Take 1 capsule (300 mg total) by mouth 3 (three) times daily. (Patient taking differently: Take 300 mg by mouth 3 (three) times daily as needed (pain.).), Disp: 90 capsule, Rfl: 3   hydrochlorothiazide (HYDRODIURIL) 25 MG tablet, Take 25 mg by mouth in the morning., Disp: , Rfl:    ibuprofen (ADVIL) 800 MG tablet, Take 800 mg by mouth 3 (three) times daily as needed (pain.)., Disp: , Rfl:    ibuprofen (ADVIL,MOTRIN) 200 MG tablet, Take 200-400 mg by mouth every 6 (six) hours as needed (For  pain.)., Disp: , Rfl:    lisinopril (PRINIVIL,ZESTRIL) 20 MG tablet, Take 20 mg by mouth in the morning., Disp: , Rfl:    meloxicam (MOBIC) 15 MG tablet, Take 1 tablet (15 mg total) by mouth daily. (Patient taking differently: Take 15 mg by mouth daily as needed for pain.), Disp: 30 tablet, Rfl: 0   mometasone (ELOCON) 0.1 % cream, Apply 1 Application topically daily., Disp: , Rfl:    oxyCODONE-acetaminophen (PERCOCET) 5-325 MG tablet, Take 1 tablet by mouth every 4 (four) hours as needed for severe pain., Disp: 20 tablet, Rfl: 0   pantoprazole (PROTONIX) 40 MG tablet, Take 40 mg by mouth daily., Disp: , Rfl:   Social History   Tobacco Use  Smoking Status Never  Smokeless Tobacco Never    Allergies  Allergen Reactions   Food Other (See Comments)    Nuts-heart palpitations (heart races)   Other Nausea And Vomiting and Other (See Comments)    Dairy products - eggs, cheese, milk    Sulfa Antibiotics Other (See Comments)    THRUSH   Objective:  There were no vitals filed for this visit. There is no height or weight on file to calculate BMI. Constitutional Well developed. Well nourished.  Vascular Dorsalis pedis pulses palpable bilaterally. Posterior tibial pulses palpable bilaterally. Capillary refill normal to all digits.  No cyanosis or clubbing noted. Pedal  hair growth normal.  Neurologic Normal speech. Oriented to person, place, and time. Epicritic sensation to light touch grossly present bilaterally.  Dermatologic Nails well groomed and normal in appearance. No open wounds. No skin lesions.  Orthopedic: Pain on palpation of left econd metatarsophalangeal joint pain with range of motion of the joint.  Unable to rule out Mulder's click/neuroma.  Negative extensor or flexor tendinitis noted.  No plantar plate rupture noted.   Radiographs: Previous hardware noted from Weil osteotomy of right second and third.  Good correction alignment noted metatarsal parabola appears to be  intact. Assessment:   1. Capsulitis of metatarsophalangeal (MTP) joint of left foot   2. Morton neuroma of left foot     Plan:  Patient was evaluated and treated and all questions answered.  Left second metatarsophalangeal joint capsulitis versus neuroma -All questions and concerns were discussed with the patient in extensive detail given the amount of pain that she is experiencing she will benefit from a steroid injection help decrease acute inflammatory component associate with pain.  Patient agrees with plan like to proceed with steroid injection A steroid injection was performed at left second MTP using 1% plain Lidocaine and 10 mg of Kenalog. This was well tolerated. -If there is no improvement we will discuss MRI. -Sugar modification was discussed.  No follow-ups on file.

## 2023-11-05 ENCOUNTER — Ambulatory Visit: Payer: PRIVATE HEALTH INSURANCE | Admitting: Podiatry

## 2024-02-12 ENCOUNTER — Telehealth: Payer: Self-pay | Admitting: Podiatry

## 2024-02-12 NOTE — Telephone Encounter (Signed)
 Patient is requesting renewal of handicap permit. Contact telephone number [phone]

## 2024-02-16 NOTE — Progress Notes (Unsigned)
 NEUROLOGY FOLLOW UP OFFICE NOTE  Mackenzie Carr [id number]  Assessment/Plan:   Migraine without aura, without status migrainosus, not intractable    Migraine prevention:  deferred Migraine rescue:  earliest onset of headache, Nurtec - take one daily up to 7 days or once headache resolved.  Due to history of TIA, triptans are contraindicated. Limit use of pain relievers to no more than 2 days out of week to prevent risk of rebound or medication-overuse headache. Keep headache diary Follow up 6 months.       Subjective:  Mackenzie Carr is a 63 year old female who follows up for migraines.  UPDATE: No significant migraines.  Has not needed to use Nurtec samples.  Eyes hurt and has been prescribed drops for dry eyes by her ophthalmologist.     Current NSAIDS/analgesics:  ASA 81mg  daily, acetaminophen, ibuprofen, meloxicam, oxycodone Current triptans:  none Current ergotamine:  none Current anti-emetic:  none Current muscle relaxants:  cylcobenzaprine 10mg  Current Antihypertensive medications:  amlodipine, lisinopril, HCTZ Current Antidepressant medications:  none Current Anticonvulsant medications:  gabapentin 300mg  TID PRN Current anti-CGRP:  Nurtec PRN Current Vitamins/Herbal/Supplements:  none Current Antihistamines/Decongestants:  Allegra Other therapy:  heat to shoulders Hormone/birth control:  none Other medications:  none    HISTORY: Onset:  Off and on for many years.  Headache-free for many years.  However she had 3 intractable migraines in 2022. Location:  diffuse Quality:  pressure Intensity:  Severe.  she denies new headache, thunderclap headache or severe headache that wakes her from sleep. Aura:  absent Prodrome:  absent Associated symptoms:  Nausea, photophobia, phonophobia.  She denies associated vomiting, visual disturbance or unilateral numbness or weakness. Duration:  10 days Frequency:  3 in last year (last was 3 months  ago) Triggers:  strong smells Relieving factors:  heating pads on shoulders Activity:  aggravates   She has had several head CTs over the past 10 years to evaluate headache.  Last CT head on 01/11/2021 personally reviewed was unremarkable.  She had what was diagnosed as a TIA on 02/03/2010 presenting as syncope followed by right leg weakness.  She had an MRI/MRA of the head, which was personally reviewed and demonstrated mild chronic small vessel ischemic changes but otherwise unremarkable.      Past NSAIDS/analgesics:  tramadol Past abortive triptans:  sumatriptan tab Past abortive ergotamine:  none Past muscle relaxants:  none Past anti-emetic:  Zofran 4mg  Past antihypertensive medications:  none Past antidepressant medications:  nortriptyline Past anticonvulsant medications:  none Past anti-CGRP:  Bernita Raisin Past vitamins/Herbal/Supplements:  none Past antihistamines/decongestants:  none Other past therapies:  prednisone  PAST MEDICAL HISTORY: Past Medical History:  Diagnosis Date   GERD (gastroesophageal reflux disease)    Hyperlipidemia    Hypertension    IBS (irritable bowel syndrome)    Migraines    TIA (transient ischemic attack)    MRI 02/03/10 normal. Echo 3/7/11normal EF,normal. Carotid doppler 02/05/10 normal. Cardionet moniitor-no AFIB    MEDICATIONS: Current Outpatient Medications on File Prior to Visit  Medication Sig Dispense Refill   amLODipine (NORVASC) 10 MG tablet Take 10 mg by mouth in the morning.     cyclobenzaprine (FLEXERIL) 10 MG tablet Take 10 mg by mouth 3 (three) times daily as needed for muscle spasms.     fexofenadine (ALLEGRA) 180 MG tablet Take 180 mg by mouth in the morning.     gabapentin (NEURONTIN) 300 MG capsule Take 1 capsule (300 mg total) by mouth 3 (three) times  daily. (Patient taking differently: Take 300 mg by mouth 3 (three) times daily as needed (pain.).) 90 capsule 3   hydrochlorothiazide (HYDRODIURIL) 25 MG tablet Take 25 mg by mouth in  the morning.     ibuprofen (ADVIL) 800 MG tablet Take 800 mg by mouth 3 (three) times daily as needed (pain.).     ibuprofen (ADVIL,MOTRIN) 200 MG tablet Take 200-400 mg by mouth every 6 (six) hours as needed (For pain.).     lisinopril (PRINIVIL,ZESTRIL) 20 MG tablet Take 20 mg by mouth in the morning.     meloxicam (MOBIC) 15 MG tablet Take 1 tablet (15 mg total) by mouth daily. (Patient taking differently: Take 15 mg by mouth daily as needed for pain.) 30 tablet 0   mometasone (ELOCON) 0.1 % cream Apply 1 Application topically daily.     oxyCODONE-acetaminophen (PERCOCET) 5-325 MG tablet Take 1 tablet by mouth every 4 (four) hours as needed for severe pain. 20 tablet 0   pantoprazole (PROTONIX) 40 MG tablet Take 40 mg by mouth daily.     No current facility-administered medications on file prior to visit.    ALLERGIES: Allergies  Allergen Reactions   Food Other (See Comments)    Nuts-heart palpitations (heart races)   Other Nausea And Vomiting and Other (See Comments)    Dairy products - eggs, cheese, milk    Sulfa Antibiotics Other (See Comments)    THRUSH    FAMILY HISTORY: Family History  Problem Relation Age of Onset   Stroke Mother    Heart disease Mother        Pacemaker   Diabetes Father    Throat cancer Father    Migraines Sister    Diabetes Sister    Pancreatic cancer Sister    Stroke Maternal Grandfather    Cancer Maternal Grandfather        unsure of type   Cancer Paternal Grandfather        unsure of type      Objective:  Blood pressure 125/81, pulse 87, height 5\' 2"  (1.575 m), weight 151 lb (68.5 kg), SpO2 99%. General: No acute distress.  Patient appears well-groomed.   Head:  Normocephalic/atraumatic Neck:  Supple.  No paraspinal tenderness.  Full range of motion. Heart:  Regular rate and rhythm. Neuro:  Alert and oriented.  Speech fluent and not dysarthric.  Language intact.  CN II-XII intact.  Bulk and tone normal.  Muscle strength 5/5 throughout.   Deep tendon reflexes 2+ throughout.  Gait normal.  Romberg negative.   Shon Millet, DO  CC: Merri Brunette, MD

## 2024-02-17 ENCOUNTER — Encounter: Payer: Self-pay | Admitting: Neurology

## 2024-02-17 ENCOUNTER — Telehealth: Payer: Self-pay | Admitting: Podiatry

## 2024-02-17 ENCOUNTER — Ambulatory Visit (INDEPENDENT_AMBULATORY_CARE_PROVIDER_SITE_OTHER): Payer: PRIVATE HEALTH INSURANCE | Admitting: Neurology

## 2024-02-17 VITALS — BP 125/81 | HR 87 | Ht 62.0 in | Wt 151.0 lb

## 2024-02-17 DIAGNOSIS — G43009 Migraine without aura, not intractable, without status migrainosus: Secondary | ICD-10-CM

## 2024-03-30 ENCOUNTER — Other Ambulatory Visit: Payer: Self-pay | Admitting: Gastroenterology

## 2024-03-30 DIAGNOSIS — K869 Disease of pancreas, unspecified: Secondary | ICD-10-CM

## 2024-04-29 ENCOUNTER — Encounter: Payer: Self-pay | Admitting: Gastroenterology

## 2024-04-30 ENCOUNTER — Encounter: Payer: Self-pay | Admitting: Gastroenterology

## 2024-05-02 ENCOUNTER — Ambulatory Visit
Admission: RE | Admit: 2024-05-02 | Discharge: 2024-05-02 | Disposition: A | Discharge status: Home / Self Care | Payer: PRIVATE HEALTH INSURANCE | Source: Ambulatory Visit | Attending: Gastroenterology | Admitting: Gastroenterology

## 2024-05-02 DIAGNOSIS — K869 Disease of pancreas, unspecified: Secondary | ICD-10-CM

## 2024-05-02 MED ORDER — GADOPICLENOL 0.5 MMOL/ML IV SOLN
6.0000 mL | Freq: Once | INTRAVENOUS | Status: AC | PRN
  Administered 2024-05-02: 6 mL via INTRAVENOUS

## 2024-05-05 ENCOUNTER — Ambulatory Visit: Payer: PRIVATE HEALTH INSURANCE | Admitting: Podiatry

## 2024-05-26 ENCOUNTER — Ambulatory Visit: Payer: PRIVATE HEALTH INSURANCE | Admitting: Podiatry

## 2024-06-24 NOTE — Telephone Encounter (Signed)
 Error

## 2024-08-24 ENCOUNTER — Encounter: Payer: Self-pay | Admitting: Lab

## 2024-09-01 ENCOUNTER — Ambulatory Visit: Payer: PRIVATE HEALTH INSURANCE | Admitting: Neurology

## 2024-10-14 ENCOUNTER — Telehealth: Payer: Self-pay | Admitting: Neurology

## 2024-10-14 ENCOUNTER — Other Ambulatory Visit: Payer: Self-pay | Admitting: Neurology

## 2024-10-14 MED ORDER — NURTEC 75 MG PO TBDP: 1.0000 | ORAL_TABLET | Freq: Every day | ORAL | 5 refills | Status: AC | PRN

## 2024-10-14 NOTE — Telephone Encounter (Signed)
 Patient advised of Dr.Jaffe note, Nurtec prescription sent to her pharmacy on file. Will likely need a PA. Okay to take with prednisone.

## 2024-10-14 NOTE — Telephone Encounter (Signed)
 Mackenzie Carr called in wanting to schedule an appt(She is scheduled and added to the waitlist). Mackenzie Carr stated her headache started Sunday, she thought it was light, she took Ibuprofen. Mentioned her throat was hurting. Monday called PCP, and was prescribed prednisone. Nurtec was taking before starting prednisone. She also stated that the migraine hit her really hard, here eyes was hurting as well.  She wants to know if the Nurtec and prednisone can be taken at the same time. She will be out of the Nurtec. She is requesting a call back.     PH: [phone]   FYI: Pt has been added to the wait list

## 2024-10-21 ENCOUNTER — Telehealth: Payer: Self-pay | Admitting: Pharmacy Technician

## 2024-10-21 ENCOUNTER — Other Ambulatory Visit (HOSPITAL_COMMUNITY): Payer: Self-pay

## 2024-10-21 NOTE — Telephone Encounter (Signed)
 Pharmacy Patient Advocate Encounter   Received notification from Pt Calls Messages that prior authorization for NURTEC 75MG  is required/requested.   Insurance verification completed.   The patient is insured through [employer]   Per test claim: PA required; PA submitted to above mentioned insurance via Latent Key/confirmation #/EOC [id number] Status is pending

## 2024-10-22 ENCOUNTER — Other Ambulatory Visit (HOSPITAL_COMMUNITY): Payer: Self-pay

## 2024-10-22 NOTE — Telephone Encounter (Signed)
 Pharmacy Patient Advocate Encounter  Received notification from [employer] that Prior Authorization for NURTEC 75MG  has been APPROVED from 11.20.25 to 2.20.26. Ran test claim, Copay is $0. This test claim was processed through [facility]- copay amounts may vary at other pharmacies due to pharmacy/plan contracts, or as the patient moves through the different stages of their insurance plan.   PA #/Case ID/Reference #: [id number]

## 2024-11-24 NOTE — Progress Notes (Signed)
 "  NEUROLOGY FOLLOW UP OFFICE NOTE  Mackenzie Carr [id number]  Assessment/Plan:   Migraine without aura, without status migrainosus, not intractable    Migraine prevention:  deferred Migraine rescue:  earliest onset of headache, Nurtec - take one daily up to 3 days or once headache resolved.  Due to history of TIA, triptans are contraindicated. Lifestyle modification: Limit use of pain relievers to no more than 9 days out of the month to prevent risk of rebound or medication-overuse headache. Diet modification/hydration/caffeine cessation Routine exercise Sleep hygiene Consider vitamins/supplements:  magnesium citrate 400mg  daily, riboflavin 400mg  daily, CoQ10 100mg  three times daily Keep headache diary Follow up 1 year       Subjective:  Mackenzie Carr is a 63 year old female who follows up for migraines.  UPDATE: Overall she had been doing well.   Last month, she had not been feeling well, attributing it to the change in barometric pressure.  Then she developed a severe migraine that wasn't responding to a Nurtec because she waited.  It persisted for the next few days.  She took a prednisone taper.  It took a week for the migraine to finally break but continued feeling weak and with a dull headache which lasted another couple of weeks.    She hasn't had a migraine since then.    Current NSAIDS/analgesics:  ASA 81mg  daily, acetaminophen , ibuprofen, meloxicam , oxycodone  Current triptans:  none Current ergotamine:  none Current anti-emetic:  none Current muscle relaxants:  cylcobenzaprine 10mg  Current Antihypertensive medications:  amlodipine , lisinopril , HCTZ Current Antidepressant medications:  none Current Anticonvulsant medications:  gabapentin  300mg  TID PRN Current anti-CGRP:  Nurtec PRN Current Vitamins/Herbal/Supplements:  none Current Antihistamines/Decongestants:  Allegra Other therapy:  heat to shoulders Hormone/birth control:  none Other  medications:  none    HISTORY: Onset:  Off and on for many years.  Headache-free for many years.  However she had 3 intractable migraines in 2022. Location:  diffuse Quality:  pressure Intensity:  Severe.  she denies new headache, thunderclap headache or severe headache that wakes her from sleep. Aura:  absent Prodrome:  absent Associated symptoms:  Nausea, photophobia, phonophobia.  She denies associated vomiting, visual disturbance or unilateral numbness or weakness. Duration:  10 days Frequency:  3 in last year (last was 3 months ago) Triggers:  strong smells Relieving factors:  heating pads on shoulders Activity:  aggravates   She has had several head CTs over the past 10 years to evaluate headache.  Last CT head on 01/11/2021 personally reviewed was unremarkable.  She had what was diagnosed as a TIA on 02/03/2010 presenting as syncope followed by right leg weakness.  She had an MRI/MRA of the head, which was personally reviewed and demonstrated mild chronic small vessel ischemic changes but otherwise unremarkable.      Past NSAIDS/analgesics:  tramadol Past abortive triptans:  sumatriptan  tab Past abortive ergotamine:  none Past muscle relaxants:  none Past anti-emetic:  Zofran  4mg  Past antihypertensive medications:  none Past antidepressant medications:  nortriptyline  Past anticonvulsant medications:  none Past anti-CGRP:  Ubrelvy  Past vitamins/Herbal/Supplements:  none Past antihistamines/decongestants:  none Other past therapies:  prednisone  PAST MEDICAL HISTORY: Past Medical History:  Diagnosis Date   GERD (gastroesophageal reflux disease)    Hyperlipidemia    Hypertension    IBS (irritable bowel syndrome)    Migraines    TIA (transient ischemic attack)    MRI 02/03/10 normal. Echo 3/7/11normal EF,normal. Carotid doppler 02/05/10 normal. Cardionet moniitor-no AFIB  MEDICATIONS: Current Outpatient Medications on File Prior to Visit  Medication Sig Dispense Refill    amLODipine  (NORVASC ) 10 MG tablet Take 10 mg by mouth in the morning.     cyclobenzaprine (FLEXERIL) 10 MG tablet Take 10 mg by mouth 3 (three) times daily as needed for muscle spasms.     fexofenadine (ALLEGRA) 180 MG tablet Take 180 mg by mouth in the morning.     gabapentin  (NEURONTIN ) 300 MG capsule Take 1 capsule (300 mg total) by mouth 3 (three) times daily. (Patient taking differently: Take 300 mg by mouth 3 (three) times daily as needed (pain.).) 90 capsule 3   hydrochlorothiazide (HYDRODIURIL) 25 MG tablet Take 25 mg by mouth in the morning.     ibuprofen (ADVIL) 800 MG tablet Take 800 mg by mouth 3 (three) times daily as needed (pain.).     ibuprofen (ADVIL,MOTRIN) 200 MG tablet Take 200-400 mg by mouth every 6 (six) hours as needed (For pain.).     lisinopril  (PRINIVIL ,ZESTRIL ) 20 MG tablet Take 20 mg by mouth in the morning.     meloxicam  (MOBIC ) 15 MG tablet Take 1 tablet (15 mg total) by mouth daily. (Patient taking differently: Take 15 mg by mouth daily as needed for pain.) 30 tablet 0   mometasone (ELOCON) 0.1 % cream Apply 1 Application topically daily.     oxyCODONE -acetaminophen  (PERCOCET) 5-325 MG tablet Take 1 tablet by mouth every 4 (four) hours as needed for severe pain. 20 tablet 0   pantoprazole (PROTONIX) 40 MG tablet Take 40 mg by mouth daily.     Rimegepant Sulfate (NURTEC) 75 MG TBDP Take 1 tablet (75 mg total) by mouth daily as needed. 8 each 5   No current facility-administered medications on file prior to visit.    ALLERGIES: Allergies  Allergen Reactions   Food Other (See Comments)    Nuts-heart palpitations (heart races)   Other Nausea And Vomiting and Other (See Comments)    Dairy products - eggs, cheese, milk    Sulfa Antibiotics Other (See Comments)    THRUSH    FAMILY HISTORY: Family History  Problem Relation Age of Onset   Stroke Mother    Heart disease Mother        Pacemaker   Diabetes Father    Throat cancer Father    Migraines Sister     Diabetes Sister    Pancreatic cancer Sister    Stroke Maternal Grandfather    Cancer Maternal Grandfather        unsure of type   Cancer Paternal Grandfather        unsure of type      Objective:  Blood pressure 125/74, pulse 67, weight 147 lb 3.2 oz (66.8 kg), SpO2 96%. General: No acute distress.  Patient appears well-groomed.   Head:  Normocephalic/atraumatic Neck:  Supple.  No paraspinal tenderness.  Full range of motion. Heart:  Regular rate and rhythm. Neuro:  Alert and oriented.  Speech fluent and not dysarthric.  Language intact.  CN II-XII intact.  Bulk and tone normal.  Muscle strength 5/5 throughout.  Sensation to light touch intact.  Deep tendon reflexes 2+ throughout, toes downgoing.  Gait normal.  Romberg negative.    Juliene Dunnings, DO  CC: Alberta Sharps, MD       "

## 2024-11-26 ENCOUNTER — Ambulatory Visit (INDEPENDENT_AMBULATORY_CARE_PROVIDER_SITE_OTHER): Payer: PRIVATE HEALTH INSURANCE | Admitting: Neurology

## 2024-11-26 VITALS — BP 125/74 | HR 67 | Wt 147.2 lb

## 2024-11-26 DIAGNOSIS — G43009 Migraine without aura, not intractable, without status migrainosus: Secondary | ICD-10-CM | POA: Diagnosis not present

## 2024-11-26 MED ORDER — UBRELVY 100 MG PO TABS: ORAL_TABLET | ORAL | Status: DC

## 2024-11-29 ENCOUNTER — Encounter: Payer: Self-pay | Admitting: Neurology

## 2025-01-11 ENCOUNTER — Ambulatory Visit: Payer: PRIVATE HEALTH INSURANCE | Admitting: Neurology

## 2025-06-15 ENCOUNTER — Ambulatory Visit: Payer: Self-pay | Admitting: Neurology
